# Patient Record
Sex: Female | Born: 1960 | Race: White | Hispanic: No | Marital: Married | Smoking: Former smoker
Health system: Southern US, Community
[De-identification: ages and names within clinical notes are randomized; demographics above are authoritative.]

## PROBLEM LIST (undated history)

## (undated) DIAGNOSIS — C801 Malignant (primary) neoplasm, unspecified: Secondary | ICD-10-CM

## (undated) DIAGNOSIS — E039 Hypothyroidism, unspecified: Secondary | ICD-10-CM

## (undated) DIAGNOSIS — I10 Essential (primary) hypertension: Secondary | ICD-10-CM

## (undated) DIAGNOSIS — F419 Anxiety disorder, unspecified: Secondary | ICD-10-CM

## (undated) DIAGNOSIS — K219 Gastro-esophageal reflux disease without esophagitis: Secondary | ICD-10-CM

## (undated) DIAGNOSIS — Z9889 Other specified postprocedural states: Secondary | ICD-10-CM

## (undated) DIAGNOSIS — D509 Iron deficiency anemia, unspecified: Secondary | ICD-10-CM

## (undated) DIAGNOSIS — T8859XA Other complications of anesthesia, initial encounter: Secondary | ICD-10-CM

## (undated) DIAGNOSIS — I499 Cardiac arrhythmia, unspecified: Secondary | ICD-10-CM

## (undated) DIAGNOSIS — R112 Nausea with vomiting, unspecified: Secondary | ICD-10-CM

## (undated) DIAGNOSIS — Z87442 Personal history of urinary calculi: Secondary | ICD-10-CM

## (undated) HISTORY — PX: CHOLECYSTECTOMY: SHX55

## (undated) HISTORY — DX: Hypothyroidism, unspecified: E03.9

## (undated) HISTORY — PX: OTHER SURGICAL HISTORY: SHX169

## (undated) HISTORY — DX: Iron deficiency anemia, unspecified: D50.9

## (undated) HISTORY — PX: TONSILLECTOMY: SUR1361

---

## 1999-04-25 ENCOUNTER — Ambulatory Visit (HOSPITAL_BASED_OUTPATIENT_CLINIC_OR_DEPARTMENT_OTHER): Admission: RE | Admit: 1999-04-25 | Discharge: 1999-04-25 | Payer: Self-pay | Admitting: Orthopedic Surgery

## 2002-03-24 HISTORY — PX: GASTRIC BYPASS: SHX52

## 2002-12-02 ENCOUNTER — Ambulatory Visit (HOSPITAL_COMMUNITY): Admission: RE | Admit: 2002-12-02 | Discharge: 2002-12-02 | Payer: Self-pay | Admitting: Family Medicine

## 2002-12-05 ENCOUNTER — Ambulatory Visit (HOSPITAL_COMMUNITY): Admission: RE | Admit: 2002-12-05 | Discharge: 2002-12-05 | Payer: Self-pay | Admitting: Family Medicine

## 2002-12-21 ENCOUNTER — Ambulatory Visit (HOSPITAL_COMMUNITY): Admission: RE | Admit: 2002-12-21 | Discharge: 2002-12-21 | Payer: Self-pay | Admitting: Cardiology

## 2003-07-27 ENCOUNTER — Ambulatory Visit (HOSPITAL_COMMUNITY): Admission: RE | Admit: 2003-07-27 | Discharge: 2003-07-27 | Payer: Self-pay | Admitting: Family Medicine

## 2003-08-04 ENCOUNTER — Emergency Department (HOSPITAL_COMMUNITY): Admission: EM | Admit: 2003-08-04 | Discharge: 2003-08-04 | Payer: Self-pay | Admitting: Emergency Medicine

## 2007-02-01 ENCOUNTER — Ambulatory Visit (HOSPITAL_COMMUNITY): Admission: RE | Admit: 2007-02-01 | Discharge: 2007-02-01 | Payer: Self-pay | Admitting: Obstetrics and Gynecology

## 2007-02-08 ENCOUNTER — Other Ambulatory Visit: Admission: RE | Admit: 2007-02-08 | Discharge: 2007-02-08 | Payer: Self-pay | Admitting: Obstetrics and Gynecology

## 2008-12-13 ENCOUNTER — Other Ambulatory Visit: Admission: RE | Admit: 2008-12-13 | Discharge: 2008-12-13 | Payer: Self-pay | Admitting: Obstetrics and Gynecology

## 2010-04-14 ENCOUNTER — Encounter: Payer: Self-pay | Admitting: Obstetrics and Gynecology

## 2010-08-09 NOTE — Procedures (Signed)
   NAME:  Raven Ferguson, Raven Ferguson NO.:  [id number]   MEDICAL RECORD NO.:  [id number]                   PATIENT TYPE:  OUT   LOCATION:  D[facility]                                 FACILITY:  [facility]   PHYSICIAN:  Donna Bernard, M.D.             DATE OF BIRTH:  [birth year]   DATE OF PROCEDURE:  DATE OF DISCHARGE:  12/02/2002                                    STRESS TEST   INDICATIONS FOR STRESS TEST:  This patient is a 50 year old white female  with a history of exertional dyspnea and atypical chest discomfort.  The  primary reason for the test was sorting out her surgical risk for obesity  surgery.   Stress test was performed at standard [facility] protocol.  Resting EKG revealed  a normal sinus rhythm, no significant ST-T changes.  The patient had  profound development of exertional dyspnea very quickly into the first  stage.  She reached a maximum heart rate of 130 which was well below her  submax predicted heart rate of 151.  The patient was expressing so much  concern about fatigue and she looked to be in so much distress, that I  elected, at that point, to stop the test.  At this point her heart rate had  not reached a point to make it reliable in terms of assessing for ischemia.  The patient does show tremendously poor exertional capacity with these  results.   IMPRESSION:  Negative, inadequate stress test.   PLAN:  Cardiology referral for further assessment.      ___________________________________________                                            Donna Bernard, M.D.   WSL/MEDQ  D:  12/13/2002  T:  12/13/2002  Job:  [id number]

## 2010-08-09 NOTE — Procedures (Signed)
   NAME:  Raven Ferguson, Raven Ferguson                           ACCOUNT NO.:  [id number]   MEDICAL RECORD NO.:  [id number]                   PATIENT TYPE:  OUT   LOCATION:  RESP                                 FACILITY:  [facility]   PHYSICIAN:  Edward L. Juanetta Gosling, M.D.             DATE OF BIRTH:  1960-03-29   DATE OF PROCEDURE:  12/06/2002  DATE OF DISCHARGE:                              PULMONARY FUNCTION TEST   IMPRESSION:  1. Spirometry shows a moderate ventilatory defect without definite airflow     obstruction.  2. Lung volumes show mild restrictive change.  3. DLCO is moderately reduced.  4. There is increase in airflow with inhaled bronchodilator, but it does not     meet criteria for significant effect.  5. Arterial blood gases show normal oxygenation and slightly increased pCO2.                                               Edward L. Juanetta Gosling, M.D.    ELH/MEDQ  D:  12/06/2002  T:  12/06/2002  Job:  [id number]   cc:   Donna Bernard, M.D.  [address]  [place]  [place] [address]  Fax: [phone]

## 2012-10-11 ENCOUNTER — Ambulatory Visit (INDEPENDENT_AMBULATORY_CARE_PROVIDER_SITE_OTHER): Payer: Self-pay | Admitting: Family Medicine

## 2012-10-11 ENCOUNTER — Encounter: Payer: Self-pay | Admitting: Family Medicine

## 2012-10-11 VITALS — BP 130/88 | Temp 98.8°F | Wt 171.0 lb

## 2012-10-11 DIAGNOSIS — K2990 Gastroduodenitis, unspecified, without bleeding: Secondary | ICD-10-CM

## 2012-10-11 DIAGNOSIS — E039 Hypothyroidism, unspecified: Secondary | ICD-10-CM | POA: Insufficient documentation

## 2012-10-11 DIAGNOSIS — K219 Gastro-esophageal reflux disease without esophagitis: Secondary | ICD-10-CM | POA: Insufficient documentation

## 2012-10-11 DIAGNOSIS — K2970 Gastritis, unspecified, without bleeding: Secondary | ICD-10-CM

## 2012-10-11 MED ORDER — PANTOPRAZOLE SODIUM 40 MG PO TBEC: 40.0000 mg | DELAYED_RELEASE_TABLET | Freq: Every day | ORAL | Status: DC

## 2012-10-11 NOTE — Progress Notes (Signed)
  Subjective:    Patient ID: Raven Ferguson, female    DOB: 1960/04/22, 52 y.o.   MRN: [id number]  GI Problem The primary symptoms include nausea and vomiting. The illness began more than 7 days ago. The problem has been gradually worsening.  The illness does not include anorexia. Significant associated medical issues include gastric bypass. Associated medical issues do not include inflammatory bowel disease, GERD or gallstones.  taking prilosec in the past, two a day helped a little  Uses a ton of maalox and peptobismol--not much helpGas x hasn't helped  Reach in the back of throat and make herself gag--gets up "nothing  Bloating sensation with pressure--felt like abubble. Unable to eat much.    Review of Systems  Gastrointestinal: Positive for nausea and vomiting. Negative for anorexia.   ROS otherwise negative     Objective:   Physical Exam  Alert vital signs stable. Lungs clear. Heart regular rate and rhythm. Some epigastric tenderness.      Assessment & Plan:  Impression probable gastritis with element of reflux post GI bypass. Plan encouraged to stop smoking. Trial protonic 40 every morning. GI consult for this plus colonoscopy. WSL

## 2012-10-13 ENCOUNTER — Other Ambulatory Visit: Payer: Self-pay | Admitting: Nurse Practitioner

## 2012-10-13 NOTE — Telephone Encounter (Signed)
Last filled Aug 07, 2012

## 2012-10-13 NOTE — Telephone Encounter (Signed)
Did we not just rx this when did she last get a rx?

## 2012-10-14 NOTE — Telephone Encounter (Signed)
Ok times one 

## 2012-10-14 NOTE — Telephone Encounter (Signed)
RX called in on voicemail 

## 2012-11-08 ENCOUNTER — Ambulatory Visit: Payer: Self-pay | Admitting: Gastroenterology

## 2012-11-30 ENCOUNTER — Other Ambulatory Visit: Payer: Self-pay | Admitting: Internal Medicine

## 2012-11-30 ENCOUNTER — Ambulatory Visit (INDEPENDENT_AMBULATORY_CARE_PROVIDER_SITE_OTHER): Payer: Self-pay | Admitting: Gastroenterology

## 2012-11-30 ENCOUNTER — Encounter: Payer: Self-pay | Admitting: Gastroenterology

## 2012-11-30 VITALS — BP 119/72 | HR 54 | Temp 98.0°F | Ht 65.0 in | Wt 173.0 lb

## 2012-11-30 DIAGNOSIS — R1013 Epigastric pain: Secondary | ICD-10-CM | POA: Insufficient documentation

## 2012-11-30 DIAGNOSIS — G8929 Other chronic pain: Secondary | ICD-10-CM

## 2012-11-30 DIAGNOSIS — Z1211 Encounter for screening for malignant neoplasm of colon: Secondary | ICD-10-CM

## 2012-11-30 MED ORDER — PANTOPRAZOLE SODIUM 40 MG PO TBEC: 40.0000 mg | DELAYED_RELEASE_TABLET | Freq: Every day | ORAL | Status: DC

## 2012-11-30 NOTE — Progress Notes (Signed)
Primary Care Physician:  Harlow Asa, MD  Primary Gastroenterologist:  Roetta Sessions, MD   Chief Complaint  Patient presents with  . Gastrophageal Reflux    HPI:  Raven Ferguson is a 52 y.o. female here at the request of Dr. Lubertha South for further evaluation recurrent nausea and vomiting, epigastric pain since gastric bypass in 2004. Also needs colonoscopy.  Patient states she underwent gastric bypass for weight loss surgery in November 2004 at [facility]. Since that time she has had recurrent epigastric pain. Initially was more frequently but then had settled down up until several months ago. Chronically has been on Omeprazole BID. Recently started pantoprazole and feels better. Pain is usually in the epigastric region. Severe enough that she goes to bed with it. May last for 12-16 hours. Goes away on its on. N/V foam-like material. Diaphoresis with episodes. Loose 3-4 pounds with episodes. Cannot tolerate any fluids/foods. Restless in the bed. Hot/cold. At the very end, terrible headache. Has been occurring every 4-6 weeks over the past several years. Recently had 3 episodes in one week. No episodes in the past six weeks, since on pantoprazole much better.   Has previously tried Maalox, Pepto-Bismol, TUMs/Rolaids, omeprazole BID. Denies constipation, diarrhea, melena, rectal bleeding, dysphagia. Weight loss surgery was a success. Previously weighed almost 400 pounds and continues to lose weight. Notes that she has chronic iron deficiency, previously advised she should have iron infusion but she did not have insurance. PCP "keeps a check on it". Stop her oral iron due to abdominal pain.   Current Outpatient Prescriptions  Medication Sig Dispense Refill  . diazepam (VALIUM) 10 MG tablet Take 5 mg by mouth at bedtime.      . Glucosamine-Chondroitin (COSAMIN DS PO) Take by mouth daily.      Marland Kitchen levothyroxine (SYNTHROID, LEVOTHROID) 150 MCG tablet Take 150 mcg by mouth daily before breakfast.       . pantoprazole (PROTONIX) 40 MG tablet Take 1 tablet (40 mg total) by mouth daily.  30 tablet  5  . PARoxetine (PAXIL) 20 MG tablet Take 20 mg by mouth every morning.       No current facility-administered medications for this visit.    Allergies as of 11/30/2012  . (No Known Allergies)    Past Medical History  Diagnosis Date  . Gestational diabetes   . Hypothyroidism   . Morbid obesity   . Iron deficiency anemia     Past Surgical History  Procedure Laterality Date  . Cesarean section    . Gastric bypass  2004  . Skin reduction      with hernia repair    Family History  Problem Relation Age of Onset  . Hyperlipidemia Father   . Cancer Maternal Aunt     breast  . Diabetes Paternal Grandmother   . Heart attack Paternal Grandmother     History   Social History  . Marital Status: Married    Spouse Name: N/A    Number of Children: N/A  . Years of Education: N/A   Occupational History  . Not on file.   Social History Main Topics  . Smoking status: Current Every Day Smoker  . Smokeless tobacco: Not on file  . Alcohol Use: Not on file  . Drug Use: Not on file  . Sexual Activity: Not on file   Other Topics Concern  . Not on file   Social History Narrative  . No narrative on file      ROS:  General: Negative for anorexia, weight loss, fever, chills, fatigue, weakness. Eyes: Negative for vision changes.  ENT: Negative for hoarseness, difficulty swallowing , nasal congestion. CV: Negative for chest pain, angina, palpitations, dyspnea on exertion, peripheral edema.  Respiratory: Negative for dyspnea at rest, dyspnea on exertion, cough, sputum, wheezing.  GI: See history of present illness. GU:  Negative for dysuria, hematuria, urinary incontinence, urinary frequency, nocturnal urination.  MS: Negative for joint pain, low back pain.  Derm: Negative for rash or itching.  Neuro: Negative for weakness, abnormal sensation, seizure, frequent headaches, memory  loss, confusion.  Psych: Negative for anxiety, depression, suicidal ideation, hallucinations.  Endo: Negative for unusual weight change.  Heme: Negative for bruising or bleeding. Allergy: Negative for rash or hives.    Physical Examination:  BP 119/72  Pulse 54  Temp(Src) 98 F (36.7 C) (Oral)  Ht 5\' 5"  (1.651 m)  Wt 173 lb (78.472 kg)  BMI 28.79 kg/m2   General: Well-nourished, well-developed in no acute distress.  Head: Normocephalic, atraumatic.   Eyes: Conjunctiva pink, no icterus. Mouth: Oropharyngeal mucosa moist and pink , no lesions erythema or exudate. Neck: Supple without thyromegaly, masses, or lymphadenopathy.  Lungs: Clear to auscultation bilaterally.  Heart: Regular rate and rhythm, no murmurs rubs or gallops.  Abdomen: Bowel sounds are normal, nontender, nondistended, no hepatosplenomegaly or masses, no abdominal bruits, no rebound or guarding. Ventral hernia.   Rectal: defer Extremities: No lower extremity edema. No clubbing or deformities.  Neuro: Alert and oriented x 4 , grossly normal neurologically.  Skin: Warm and dry, no rash or jaundice.   Psych: Alert and cooperative, normal mood and affect.

## 2012-11-30 NOTE — Assessment & Plan Note (Signed)
No prior colonoscopy. First ever screening colonoscopy at this time.  I have discussed the risks, alternatives, benefits with regards to but not limited to the risk of reaction to medication, bleeding, infection, perforation and the patient is agreeable to proceed. Written consent to be obtained.  Consider CBC to follow up on h/o iron deficiency. Await colonoscopy/EGD findings.

## 2012-11-30 NOTE — Assessment & Plan Note (Signed)
Recurrent epigastric pain associated with vomiting since gastric bypass in 2004. Doing better on pantoprazole. Previously failed omeprazole twice a day. Differential diagnosis includes gastritis, peptic ulcer disease, anastomotic ulcer, partial gastric outlet obstruction, GERD. Cannot exclude biliary etiology at this time.  Recommend EGD for further evaluation. She is feeling better on pantoprazole. New prescription provided.  I have discussed the risks, alternatives, benefits with regards to but not limited to the risk of reaction to medication, bleeding, infection, perforation and the patient is agreeable to proceed. Written consent to be obtained.  Based on findings, consider labs including LFTs, CBC

## 2012-11-30 NOTE — Patient Instructions (Addendum)
1. Continue pantoprazole once daily. Prescription sent to [facility]. 2. We have scheduled you for a colonoscopy and upper endoscopy. Please see separate instructions.

## 2012-12-01 NOTE — Progress Notes (Signed)
cc'd to PCP °

## 2012-12-02 ENCOUNTER — Encounter (HOSPITAL_COMMUNITY): Payer: Self-pay | Admitting: *Deleted

## 2012-12-02 ENCOUNTER — Encounter (HOSPITAL_COMMUNITY)
Admission: RE | Disposition: A | Discharge status: Home / Self Care | Payer: Self-pay | Source: Ambulatory Visit | Attending: Internal Medicine

## 2012-12-02 ENCOUNTER — Ambulatory Visit (HOSPITAL_COMMUNITY)
Admission: RE | Admit: 2012-12-02 | Discharge: 2012-12-02 | Disposition: A | Discharge status: Home / Self Care | Payer: Self-pay | Source: Ambulatory Visit | Attending: Internal Medicine | Admitting: Internal Medicine

## 2012-12-02 DIAGNOSIS — Z9884 Bariatric surgery status: Secondary | ICD-10-CM

## 2012-12-02 DIAGNOSIS — K5730 Diverticulosis of large intestine without perforation or abscess without bleeding: Secondary | ICD-10-CM

## 2012-12-02 DIAGNOSIS — D126 Benign neoplasm of colon, unspecified: Secondary | ICD-10-CM | POA: Insufficient documentation

## 2012-12-02 DIAGNOSIS — Z1211 Encounter for screening for malignant neoplasm of colon: Secondary | ICD-10-CM | POA: Insufficient documentation

## 2012-12-02 DIAGNOSIS — R1013 Epigastric pain: Secondary | ICD-10-CM | POA: Insufficient documentation

## 2012-12-02 DIAGNOSIS — G8929 Other chronic pain: Secondary | ICD-10-CM

## 2012-12-02 DIAGNOSIS — R112 Nausea with vomiting, unspecified: Secondary | ICD-10-CM

## 2012-12-02 HISTORY — PX: COLONOSCOPY WITH ESOPHAGOGASTRODUODENOSCOPY (EGD): SHX5779

## 2012-12-02 SURGERY — COLONOSCOPY WITH ESOPHAGOGASTRODUODENOSCOPY (EGD)
Anesthesia: Moderate Sedation

## 2012-12-02 MED ORDER — ONDANSETRON HCL 4 MG/2ML IJ SOLN
INTRAMUSCULAR | Status: DC | PRN
  Administered 2012-12-02: 4 mg via INTRAVENOUS

## 2012-12-02 MED ORDER — BUTAMBEN-TETRACAINE-BENZOCAINE 2-2-14 % EX AERO
INHALATION_SPRAY | CUTANEOUS | Status: DC | PRN
  Administered 2012-12-02: 2 via TOPICAL

## 2012-12-02 MED ORDER — MEPERIDINE HCL 100 MG/ML IJ SOLN
INTRAMUSCULAR | Status: AC
  Filled 2012-12-02: qty 2

## 2012-12-02 MED ORDER — MIDAZOLAM HCL 5 MG/5ML IJ SOLN
INTRAMUSCULAR | Status: DC | PRN
  Administered 2012-12-02 (×2): 2 mg via INTRAVENOUS
  Administered 2012-12-02 (×4): 1 mg via INTRAVENOUS

## 2012-12-02 MED ORDER — MIDAZOLAM HCL 5 MG/5ML IJ SOLN
INTRAMUSCULAR | Status: AC
  Filled 2012-12-02: qty 10

## 2012-12-02 MED ORDER — STERILE WATER FOR IRRIGATION IR SOLN
Status: DC | PRN
  Administered 2012-12-02: 11:00:00

## 2012-12-02 MED ORDER — ONDANSETRON HCL 4 MG/2ML IJ SOLN
INTRAMUSCULAR | Status: AC
  Filled 2012-12-02: qty 2

## 2012-12-02 MED ORDER — SODIUM CHLORIDE 0.9 % IV SOLN
INTRAVENOUS | Status: DC
  Administered 2012-12-02: 11:00:00 via INTRAVENOUS

## 2012-12-02 MED ORDER — MEPERIDINE HCL 100 MG/ML IJ SOLN
INTRAMUSCULAR | Status: DC | PRN
  Administered 2012-12-02: 25 mg via INTRAVENOUS
  Administered 2012-12-02 (×2): 50 mg via INTRAVENOUS

## 2012-12-02 NOTE — Interval H&P Note (Signed)
History and Physical Interval Note:  12/02/2012 11:19 AM  Raven Ferguson  has presented today for surgery, with the diagnosis of Epigastric Pain, screening colonoscopy  The various methods of treatment have been discussed with the patient and family. After consideration of risks, benefits and other options for treatment, the patient has consented to  Procedure(s) with comments: COLONOSCOPY WITH ESOPHAGOGASTRODUODENOSCOPY (EGD) (N/A) - 11:15 as a surgical intervention .  The patient's history has been reviewed, patient examined, no change in status, stable for surgery.  I have reviewed the patient's chart and labs.  Questions were answered to the patient's satisfaction.     This lady reports a dramatic and sustained improvement in nausea/abdominal discomfort (essentially resolved) since starting pantoprazole 2 months ago.  EGD and colonoscopy per plan.The risks, benefits, limitations, imponderables and alternatives regarding both EGD and colonoscopy have been reviewed with the patient. Questions have been answered. All parties agreeable.    [name]

## 2012-12-02 NOTE — H&P (View-Only) (Signed)
Primary Care Physician:  LUKING,W S, MD  Primary Gastroenterologist:  Michael Rourk, MD   Chief Complaint  Patient presents with  . Gastrophageal Reflux    HPI:  Raven Ferguson is a 52 y.o. female here at the request of Dr. Steve Luking for further evaluation recurrent nausea and vomiting, epigastric pain since gastric bypass in 2004. Also needs colonoscopy.  Patient states she underwent gastric bypass for weight loss surgery in November 2004 at [facility]. Since that time she has had recurrent epigastric pain. Initially was more frequently but then had settled down up until several months ago. Chronically has been on Omeprazole BID. Recently started pantoprazole and feels better. Pain is usually in the epigastric region. Severe enough that she goes to bed with it. May last for 12-16 hours. Goes away on its on. N/V foam-like material. Diaphoresis with episodes. Loose 3-4 pounds with episodes. Cannot tolerate any fluids/foods. Restless in the bed. Hot/cold. At the very end, terrible headache. Has been occurring every 4-6 weeks over the past several years. Recently had 3 episodes in one week. No episodes in the past six weeks, since on pantoprazole much better.   Has previously tried Maalox, Pepto-Bismol, TUMs/Rolaids, omeprazole BID. Denies constipation, diarrhea, melena, rectal bleeding, dysphagia. Weight loss surgery was a success. Previously weighed almost 400 pounds and continues to lose weight. Notes that she has chronic iron deficiency, previously advised she should have iron infusion but she did not have insurance. PCP "keeps a check on it". Stop her oral iron due to abdominal pain.   Current Outpatient Prescriptions  Medication Sig Dispense Refill  . diazepam (VALIUM) 10 MG tablet Take 5 mg by mouth at bedtime.      . Glucosamine-Chondroitin (COSAMIN DS PO) Take by mouth daily.      . levothyroxine (SYNTHROID, LEVOTHROID) 150 MCG tablet Take 150 mcg by mouth daily before breakfast.       . pantoprazole (PROTONIX) 40 MG tablet Take 1 tablet (40 mg total) by mouth daily.  30 tablet  5  . PARoxetine (PAXIL) 20 MG tablet Take 20 mg by mouth every morning.       No current facility-administered medications for this visit.    Allergies as of 11/30/2012  . (No Known Allergies)    Past Medical History  Diagnosis Date  . Gestational diabetes   . Hypothyroidism   . Morbid obesity   . Iron deficiency anemia     Past Surgical History  Procedure Laterality Date  . Cesarean section    . Gastric bypass  2004  . Skin reduction      with hernia repair    Family History  Problem Relation Age of Onset  . Hyperlipidemia Father   . Cancer Maternal Aunt     breast  . Diabetes Paternal Grandmother   . Heart attack Paternal Grandmother     History   Social History  . Marital Status: Married    Spouse Name: N/A    Number of Children: N/A  . Years of Education: N/A   Occupational History  . Not on file.   Social History Main Topics  . Smoking status: Current Every Day Smoker  . Smokeless tobacco: Not on file  . Alcohol Use: Not on file  . Drug Use: Not on file  . Sexual Activity: Not on file   Other Topics Concern  . Not on file   Social History Narrative  . No narrative on file      ROS:    General: Negative for anorexia, weight loss, fever, chills, fatigue, weakness. Eyes: Negative for vision changes.  ENT: Negative for hoarseness, difficulty swallowing , nasal congestion. CV: Negative for chest pain, angina, palpitations, dyspnea on exertion, peripheral edema.  Respiratory: Negative for dyspnea at rest, dyspnea on exertion, cough, sputum, wheezing.  GI: See history of present illness. GU:  Negative for dysuria, hematuria, urinary incontinence, urinary frequency, nocturnal urination.  MS: Negative for joint pain, low back pain.  Derm: Negative for rash or itching.  Neuro: Negative for weakness, abnormal sensation, seizure, frequent headaches, memory  loss, confusion.  Psych: Negative for anxiety, depression, suicidal ideation, hallucinations.  Endo: Negative for unusual weight change.  Heme: Negative for bruising or bleeding. Allergy: Negative for rash or hives.    Physical Examination:  BP 119/72  Pulse 54  Temp(Src) 98 F (36.7 C) (Oral)  Ht 5' 5" (1.651 m)  Wt 173 lb (78.472 kg)  BMI 28.79 kg/m2   General: Well-nourished, well-developed in no acute distress.  Head: Normocephalic, atraumatic.   Eyes: Conjunctiva pink, no icterus. Mouth: Oropharyngeal mucosa moist and pink , no lesions erythema or exudate. Neck: Supple without thyromegaly, masses, or lymphadenopathy.  Lungs: Clear to auscultation bilaterally.  Heart: Regular rate and rhythm, no murmurs rubs or gallops.  Abdomen: Bowel sounds are normal, nontender, nondistended, no hepatosplenomegaly or masses, no abdominal bruits, no rebound or guarding. Ventral hernia.   Rectal: defer Extremities: No lower extremity edema. No clubbing or deformities.  Neuro: Alert and oriented x 4 , grossly normal neurologically.  Skin: Warm and dry, no rash or jaundice.   Psych: Alert and cooperative, normal mood and affect.    

## 2012-12-02 NOTE — Op Note (Signed)
[facility] [address] [place], [address]   COLONOSCOPY PROCEDURE REPORT  PATIENT: Raven, Ferguson  MR#:         [id number] BIRTHDATE: 1960-10-13 , 52  yrs. old GENDER: Female ENDOSCOPIST: R.  Roetta Sessions, MD FACP [facility] REFERRED BY:  Simone Curia, M.D. PROCEDURE DATE:  12/02/2012 PROCEDURE:     Ileocolonoscopy with biopsy  INDICATIONS: First-ever average risk colorectal cancer screening examination  INFORMED CONSENT:  The risks, benefits, alternatives and imponderables including but not limited to bleeding, perforation as well as the possibility of a missed lesion have been reviewed.  The potential for biopsy, lesion removal, etc. have also been discussed.  Questions have been answered.  All parties agreeable. Please see the history and physical in the medical record for more information.  MEDICATIONS: Versed 8 mg IV and Demerol 125 mg IV in divided doses. Zofran 4 mg IV.  DESCRIPTION OF PROCEDURE:  After a digital rectal exam was performed, the     colonoscope was advanced from the anus through the rectum and colon to the area of the cecum, ileocecal valve and appendiceal orifice.  The cecum was deeply intubated.  These structures were well-seen and photographed for the record.  From the level of the cecum and ileocecal valve, the scope was slowly and cautiously withdrawn.  The mucosal surfaces were carefully surveyed utilizing scope tip deflection to facilitate fold flattening as needed.  The scope was pulled down into the rectum where a thorough examination including retroflexion was performed.     FINDINGS:  Adequate preparation. Normal rectum. Scattered sigmoid diverticula; (1) 4 mm polyp in the mid sigmoid segment; otherwise, the remainder of the colonic mucosa appeared normal. The distal 5 cm of terminal ileal mucosa also appeared normal.  THERAPEUTIC / DIAGNOSTIC MANEUVERS PERFORMED:  The above-mentioned polyp was cold  biopsied/removed.  COMPLICATIONS: None  CECAL WITHDRAWAL TIME:  8 minutes  IMPRESSION:  Colonic diverticulosis. Single colonic polyp-removed as described above  RECOMMENDATIONS: Followup on pathology. See EGD report.   _______________________________ eSigned:  R. Roetta Sessions, MD FACP [facility] 12/02/2012 12:10 PM   CC:    PATIENT NAME:  Raven, Ferguson MR#: [id number]

## 2012-12-02 NOTE — Op Note (Signed)
[facility] [address] [place], [address]   ENDOSCOPY PROCEDURE REPORT  PATIENT: Raven Ferguson, Raven Ferguson  MR#: [id number] BIRTHDATE: Aug 27, 1960 , 52  yrs. old GENDER: Female ENDOSCOPIST: R.  Roetta Sessions, MD FACP [facility] REFERRED BY:  Simone Curia, M.D. PROCEDURE DATE:  12/02/2012 PROCEDURE:     EGD with biopsy  INDICATIONS:     nausea and vomiting  -  status post bariatric surgery-symptoms now much improved with pantoprazole therapy INFORMED CONSENT:   The risks, benefits, limitations, alternatives and imponderables have been discussed.  The potential for biopsy, esophogeal dilation, etc. have also been reviewed.  Questions have been answered.  All parties agreeable.  Please see the history and physical in the medical record for more information.  MEDICATIONS:  Versed 6 mg IV and Demerol 125 mg IV in divided doses. Zofran 4 mg on a. Cetacaine spray.  DESCRIPTION OF PROCEDURE:   The     endoscope was introduced through the mouth and advanced to the second portion of the duodenum without difficulty or limitations.  The mucosal surfaces were surveyed very carefully during advancement of the scope and upon withdrawal.  Retroflexion view of the proximal stomach and esophagogastric junction was performed.      FINDINGS: Normal tubular esophagus. Surgically altered stomach; residual gastric pouch very small with outlet to efferent  small bowel limb. Some protruding sutures present. Mucosa at anastomosis appears somewhat irregular; certainly, did not see any overt infiltrating process. The residual gastric and small bowel mucosa appeared otherwise  entirely normal.  THERAPEUTIC / DIAGNOSTIC MANEUVERS PERFORMED:  biopsies of the anastomotic mucosa taken for histologic study   COMPLICATIONS:  None  IMPRESSION:  Status post bariatric surgery.   Abnormal anastomosis of doubtful clinical significance-status post biopsy  Patient has become virtually asymptomatic as  far as her upper GI tract symptoms are concerned with pantoprazole 40 mg once daily. I suspect her recent symptoms are largely related to poorly controlled GERD  RECOMMENDATIONS:  Continue pantoprazole 40 mg daily.  Followup on pathology. See colonoscopy report.    _______________________________ R. Roetta Sessions, MD FACP [facility] eSigned:  R. Roetta Sessions, MD FACP [facility] 12/02/2012 11:50 AM     CC:

## 2012-12-06 ENCOUNTER — Other Ambulatory Visit: Payer: Self-pay | Admitting: Nurse Practitioner

## 2012-12-08 ENCOUNTER — Encounter (HOSPITAL_COMMUNITY): Payer: Self-pay | Admitting: Internal Medicine

## 2012-12-11 ENCOUNTER — Encounter: Payer: Self-pay | Admitting: Internal Medicine

## 2012-12-21 ENCOUNTER — Telehealth: Payer: Self-pay | Admitting: Gastroenterology

## 2012-12-21 ENCOUNTER — Encounter: Payer: Self-pay | Admitting: Internal Medicine

## 2012-12-21 NOTE — Telephone Encounter (Signed)
Pt has follow up appt with [place] on 01/11/13

## 2012-12-21 NOTE — Telephone Encounter (Signed)
Please encourage patient to continue to monitor for IDA. She can have CBC, iron/ferritin done with [place] or with PCP.

## 2013-01-11 ENCOUNTER — Encounter: Payer: Self-pay | Admitting: Gastroenterology

## 2013-01-11 ENCOUNTER — Encounter (INDEPENDENT_AMBULATORY_CARE_PROVIDER_SITE_OTHER): Payer: Self-pay

## 2013-01-11 ENCOUNTER — Ambulatory Visit (INDEPENDENT_AMBULATORY_CARE_PROVIDER_SITE_OTHER): Payer: Self-pay | Admitting: Gastroenterology

## 2013-01-11 VITALS — BP 130/73 | HR 55 | Temp 97.6°F | Wt 174.2 lb

## 2013-01-11 DIAGNOSIS — R1013 Epigastric pain: Secondary | ICD-10-CM

## 2013-01-11 NOTE — Progress Notes (Signed)
cc'd to pcp 

## 2013-01-11 NOTE — Progress Notes (Signed)
Primary Care Physician: Harlow Asa, MD  Primary Gastroenterologist:  Roetta Sessions, MD   Chief Complaint  Patient presents with  . Follow-up    HPI: Raven Ferguson is a 52 y.o. female here for followup of recent procedures. She had an EGD done for nausea and vomiting/epigastric pain on 12/02/2012. She status post bariatric surgery. She had an abnormal anastomosis of doubtful clinical significance, biopsy was unremarkable. Ileocolonoscopy done for colon cancer screening. She had colonic diverticulosis and a single colonic polyp which is benign. Recommended to have a followup colonoscopy in 10 years.  Prior to her initial visit with [place] she was on omeprazole twice daily. She was switched to pantoprazole with marked improvement of her symptoms. She also has a history of chronic iron deficiency anemia, previously recommended iron infusion but she did not have done due to lack of insurance. Recently stopped her oral iron therapy distal abdominal pain.  Patient states that her symptoms have completely resolved on pantoprazole. She's been doing remarkably well. No bowel issues. No blood in the stool or melena. No burning in the stomach. She goes to see her PCP on Friday and plans to have her iron level checked again.  Current Outpatient Prescriptions  Medication Sig Dispense Refill  . diazepam (VALIUM) 10 MG tablet Take 5 mg by mouth at bedtime.      . Glucosamine-Chondroitin (COSAMIN DS PO) Take by mouth daily.      Marland Kitchen levothyroxine (SYNTHROID, LEVOTHROID) 150 MCG tablet Take 150 mcg by mouth daily before breakfast.      . pantoprazole (PROTONIX) 40 MG tablet Take 1 tablet (40 mg total) by mouth daily.  30 tablet  5  . PARoxetine (PAXIL) 20 MG tablet TAKE ONE TABLET BY MOUTH EVERY DAY  30 tablet  0   No current facility-administered medications for this visit.    Allergies as of 01/11/2013  . (No Known Allergies)    ROS:  General: Negative for anorexia, weight loss, fever, chills, fatigue,  weakness. ENT: Negative for hoarseness, difficulty swallowing , nasal congestion. CV: Negative for chest pain, angina, palpitations, dyspnea on exertion, peripheral edema.  Respiratory: Negative for dyspnea at rest, dyspnea on exertion, cough, sputum, wheezing.  GI: See history of present illness. GU:  Negative for dysuria, hematuria, urinary incontinence, urinary frequency, nocturnal urination.  Endo: Negative for unusual weight change.    Physical Examination:   BP 130/73  Pulse 55  Temp(Src) 97.6 F (36.4 C)  Wt 174 lb 3.2 oz (79.017 kg)  BMI 28.99 kg/m2  General: Well-nourished, well-developed in no acute distress.  Eyes: No icterus. Mouth: Oropharyngeal mucosa moist and pink , no lesions erythema or exudate. Lungs: Clear to auscultation bilaterally.  Heart: Regular rate and rhythm, no murmurs rubs or gallops.  Abdomen: Bowel sounds are normal, nontender, nondistended, no hepatosplenomegaly or masses, no abdominal bruits or hernia , no rebound or guarding.   Extremities: No lower extremity edema. No clubbing or deformities. Neuro: Alert and oriented x 4   Skin: Warm and dry, no jaundice.   Psych: Alert and cooperative, normal mood and affect.

## 2013-01-11 NOTE — Patient Instructions (Signed)
1. Continue to have your iron monitored by your PCP. 2. Continue pantoprazole daily for next 1-2 months. Then you can try to wean off of it. If recurrent symptoms, start back on it daily to every other day. 3. You next colonoscopy will be due at age 52. 4. Slowly add back iron supplement.

## 2013-01-11 NOTE — Assessment & Plan Note (Signed)
Resolved on pantoprazole. Continue PPI for one to 2 more months. At that time she can try weaning herself off of it but if she has return of her symptoms she should resume the medication, taking once every day to every other day. She has been encouraged to followup with her PCP regarding checking on her iron level given her history that she provides iron deficiency status post gastric bypass. Resume oral iron supplements. Office visit here as needed.

## 2013-01-14 ENCOUNTER — Ambulatory Visit (INDEPENDENT_AMBULATORY_CARE_PROVIDER_SITE_OTHER): Payer: Self-pay | Admitting: Nurse Practitioner

## 2013-01-14 ENCOUNTER — Encounter: Payer: Self-pay | Admitting: Nurse Practitioner

## 2013-01-14 VITALS — BP 152/84 | Ht 65.0 in | Wt 174.0 lb

## 2013-01-14 DIAGNOSIS — D649 Anemia, unspecified: Secondary | ICD-10-CM

## 2013-01-14 DIAGNOSIS — E039 Hypothyroidism, unspecified: Secondary | ICD-10-CM

## 2013-01-14 DIAGNOSIS — F411 Generalized anxiety disorder: Secondary | ICD-10-CM

## 2013-01-14 DIAGNOSIS — F419 Anxiety disorder, unspecified: Secondary | ICD-10-CM

## 2013-01-14 DIAGNOSIS — G4700 Insomnia, unspecified: Secondary | ICD-10-CM

## 2013-01-14 LAB — POCT HEMOGLOBIN: Hemoglobin: 12.9 g/dL (ref 12.2–16.2)

## 2013-01-14 MED ORDER — DIAZEPAM 10 MG PO TABS: 10.0000 mg | ORAL_TABLET | Freq: Every evening | ORAL | Status: DC | PRN

## 2013-01-14 MED ORDER — PAROXETINE HCL 20 MG PO TABS: ORAL_TABLET | ORAL | Status: DC

## 2013-01-14 MED ORDER — LEVOTHYROXINE SODIUM 150 MCG PO TABS: 150.0000 ug | ORAL_TABLET | Freq: Every day | ORAL | Status: DC

## 2013-01-16 ENCOUNTER — Encounter: Payer: Self-pay | Admitting: Nurse Practitioner

## 2013-01-16 DIAGNOSIS — G4700 Insomnia, unspecified: Secondary | ICD-10-CM | POA: Insufficient documentation

## 2013-01-16 DIAGNOSIS — F419 Anxiety disorder, unspecified: Secondary | ICD-10-CM | POA: Insufficient documentation

## 2013-01-16 DIAGNOSIS — E039 Hypothyroidism, unspecified: Secondary | ICD-10-CM | POA: Insufficient documentation

## 2013-01-16 NOTE — Progress Notes (Signed)
Subjective:  Presents for routine followup. Recently had followup with GI following colonoscopy and EGD. Has been off her iron supplement for about a month. Remains uninsured, needs to limit workup. Compliant with thyroid medication. Doing well with her weight. Stays active. Does not think she can quit smoking at this time due to her level of stress. Doing well on her Paxil at current dose. Uses occasional Valium only if absolutely necessary for sleep, sometimes does not use it for a week or more.  Objective:   BP 152/84  Ht 5\' 5"  (1.651 m)  Wt 174 lb (78.926 kg)  BMI 28.96 kg/m2 NAD. Alert, oriented. Calm affect. Lungs clear. Heart regular rate rhythm. Thyroid normal limit to palpation and nontender. Hemoglobin 12.9. Lab dated 03/29/2012 normal TSH and T4.  Assessment:Hypothyroidism  Anemia - Plan: POCT hemoglobin  Insomnia  Anxiety  Anemia resolved  Plan: Meds ordered this encounter  Medications  . diazepam (VALIUM) 10 MG tablet    Sig: Take 1 tablet (10 mg total) by mouth at bedtime as needed for anxiety.    Dispense:  30 tablet    Refill:  2    Order Specific Question:  Supervising Provider    Answer:  Merlyn Albert [[id number]]  . levothyroxine (SYNTHROID, LEVOTHROID) 150 MCG tablet    Sig: Take 1 tablet (150 mcg total) by mouth daily before breakfast.    Dispense:  90 tablet    Refill:  1    Order Specific Question:  Supervising Provider    Answer:  Merlyn Albert [[id number]]  . PARoxetine (PAXIL) 20 MG tablet    Sig: TAKE ONE TABLET BY MOUTH EVERY DAY    Dispense:  90 tablet    Refill:  1    Order Specific Question:  Supervising Provider    Answer:  Merlyn Albert [[id number]]   Recommend preventive health physical in the future if patient can afford it. Recheck in 6 months, call back sooner if any problems.

## 2013-01-16 NOTE — Assessment & Plan Note (Signed)
Lab work in January 2014 was normal. Continue levothyroxine at current dose.

## 2013-01-16 NOTE — Assessment & Plan Note (Signed)
Continue Paxil 20 mg daily

## 2013-01-16 NOTE — Assessment & Plan Note (Signed)
Continue sporadic use of Valium at bedtime as needed for sleep. Patient is using this very sparingly.

## 2013-03-03 ENCOUNTER — Encounter: Payer: Self-pay | Admitting: Internal Medicine

## 2013-06-30 ENCOUNTER — Other Ambulatory Visit: Payer: Self-pay | Admitting: Nurse Practitioner

## 2013-07-01 ENCOUNTER — Telehealth: Payer: Self-pay | Admitting: Nurse Practitioner

## 2013-07-01 ENCOUNTER — Other Ambulatory Visit: Payer: Self-pay | Admitting: Nurse Practitioner

## 2013-07-01 DIAGNOSIS — E039 Hypothyroidism, unspecified: Secondary | ICD-10-CM

## 2013-07-01 NOTE — Telephone Encounter (Signed)
She did not have any insurance last time I saw her. Does she just want thyroid test or other routine labs?

## 2013-07-01 NOTE — Telephone Encounter (Signed)
Patient stated that she was told she has to have her thyroid checked in order to get her medicine refilled and she is almost out. She does not have insurance and she said if she can get by without having bloodwork and is able to get her medicine refilled that would be great too.

## 2013-07-01 NOTE — Telephone Encounter (Signed)
Pt thinks she needs her BW orders for her thyroid check and what ever else you want to run  Call when ready please

## 2013-08-10 ENCOUNTER — Encounter: Payer: Self-pay | Admitting: Family Medicine

## 2013-08-10 ENCOUNTER — Ambulatory Visit (INDEPENDENT_AMBULATORY_CARE_PROVIDER_SITE_OTHER): Payer: Self-pay | Admitting: Family Medicine

## 2013-08-10 VITALS — BP 138/90 | Temp 98.5°F | Ht 65.0 in | Wt 179.0 lb

## 2013-08-10 DIAGNOSIS — M6740 Ganglion, unspecified site: Secondary | ICD-10-CM

## 2013-08-10 MED ORDER — ETODOLAC 400 MG PO TABS: 400.0000 mg | ORAL_TABLET | Freq: Two times a day (BID) | ORAL | Status: DC

## 2013-08-10 NOTE — Progress Notes (Signed)
   Subjective:    Patient ID: Raven Ferguson, female    DOB: 1961/01/11, 53 y.o.   MRN: [id number]  Wrist Pain  The pain is present in the left wrist. This is a new problem. Episode onset: 8 weeks ago. There has been no history of extremity trauma. The problem occurs daily. The problem has been gradually worsening. The quality of the pain is described as sharp, pounding, aching and burning. The pain is moderate. Associated symptoms include a limited range of motion. The symptoms are aggravated by activity. She has tried NSAIDS and rest (wrap ) for the symptoms. The treatment provided no relief.   Progressive   [id number] Review of Systems No pain elsewhere recalls no injury over-the-counter medicines and not helping much no fever no headache no chest pain ROS otherwise negative    Objective:   Physical Exam  Alert vitals stable. Lungs clear. Heart regular in rhythm. Wrist palpable ganglion on cyst. Decent range of motion. Positive tenderness to palpation.      Assessment & Plan:  Impression progressively symptomatic ganglion ounces plan anti-inflammatory medication when necessary. Symptomatic care discussed. Or so referral rationale discussed. WSL

## 2013-08-19 ENCOUNTER — Telehealth: Payer: Self-pay | Admitting: Family Medicine

## 2013-08-19 NOTE — Telephone Encounter (Signed)
Calling to check on status of referral to Dr. Aline Brochure.

## 2013-08-19 NOTE — Telephone Encounter (Signed)
Calling to check on status of referral to Dr. Althia Forts.

## 2013-08-23 NOTE — Telephone Encounter (Signed)
Spoke to pt, explained that I have her referral in my "to do" list, she will hear from Dr. Ruthe Mannan office once it's scheduled

## 2013-09-22 ENCOUNTER — Ambulatory Visit (INDEPENDENT_AMBULATORY_CARE_PROVIDER_SITE_OTHER): Payer: Self-pay

## 2013-09-22 ENCOUNTER — Ambulatory Visit (INDEPENDENT_AMBULATORY_CARE_PROVIDER_SITE_OTHER): Payer: Self-pay | Admitting: Orthopedic Surgery

## 2013-09-22 DIAGNOSIS — M25532 Pain in left wrist: Secondary | ICD-10-CM

## 2013-09-22 DIAGNOSIS — M25539 Pain in unspecified wrist: Secondary | ICD-10-CM

## 2013-09-22 NOTE — Progress Notes (Signed)
Patient ID: Raven Ferguson, female   DOB: 1960-08-11, 53 y.o.   MRN: [id number] Chief Complaint  Patient presents with  . Wrist Pain    Left wrist pain x3-4 months    53 year old female presents with history of acute onset of pain in her left wrist on the volar aspect about 3-4 months ago. Should acute pain and swelling with dull throbbing aching sensation in the volar aspect of the wrist in the insertion of the flexor carpi radialis tendon. She was treated with bracing and Lodine. The Lodine upset her stomach she went to [place] and in the last 3-4 days has noted improvement in her symptoms.  She has a medical history of some depression and thyroid disease  She's had foot surgery carpal tunnel release on the left cesarean sections gastric bypass herniorrhaphy and tummy Tuck. She is on Paxil Levoxyl and Protonix  No allergies  Family history diabetes heart disease and heart attack  Review of systems she reports difficulty sleeping vision disturbance thyroid disorder depression joint pain muscle weakness hearing aid/dentures no chest pain or respiratory problems  Her appearance is normal she is oriented x3 mood and affect are normal. Just some tenderness at the FCR insertion on the volar aspect of the wrist has full range of motion of the wrist the wrist joint is stable her strength is normal her skin is intact has good pulses and good perfusion  X-rays negative  Impression acute inflammation of the wrist possible FCR tendon  No treatment necessary patient's symptoms have resolved

## 2013-10-07 ENCOUNTER — Telehealth: Payer: Self-pay | Admitting: Family Medicine

## 2013-10-07 DIAGNOSIS — Z13220 Encounter for screening for lipoid disorders: Secondary | ICD-10-CM

## 2013-10-07 DIAGNOSIS — E039 Hypothyroidism, unspecified: Secondary | ICD-10-CM

## 2013-10-07 NOTE — Telephone Encounter (Signed)
Patient needs order for blood work. °

## 2013-10-07 NOTE — Telephone Encounter (Signed)
Only BW done in [facility] was TSH on 07/01/13

## 2013-10-09 NOTE — Telephone Encounter (Signed)
Lipid, metabolic 7, TSH. 

## 2013-10-10 NOTE — Telephone Encounter (Signed)
Notified patient via VM stating blood work orders are in. 

## 2013-10-11 LAB — BASIC METABOLIC PANEL
BUN: 11 mg/dL (ref 6–23)
CALCIUM: 9 mg/dL (ref 8.4–10.5)
CO2: 28 meq/L (ref 19–32)
CREATININE: 0.62 mg/dL (ref 0.50–1.10)
Chloride: 105 mEq/L (ref 96–112)
Glucose, Bld: 90 mg/dL (ref 70–99)
Potassium: 4.5 mEq/L (ref 3.5–5.3)
SODIUM: 139 meq/L (ref 135–145)

## 2013-10-11 LAB — LIPID PANEL
CHOL/HDL RATIO: 3.5 ratio
Cholesterol: 159 mg/dL (ref 0–200)
HDL: 46 mg/dL (ref >39)
LDL CALC: 99 mg/dL (ref 0–99)
TRIGLYCERIDES: 70 mg/dL (ref <150)
VLDL: 14 mg/dL (ref 0–40)

## 2013-10-12 LAB — TSH: TSH: 0.955 u[IU]/mL (ref 0.350–4.500)

## 2013-10-17 ENCOUNTER — Ambulatory Visit (INDEPENDENT_AMBULATORY_CARE_PROVIDER_SITE_OTHER): Payer: Self-pay | Admitting: Nurse Practitioner

## 2013-10-17 ENCOUNTER — Encounter: Payer: Self-pay | Admitting: Nurse Practitioner

## 2013-10-17 VITALS — BP 132/84 | Ht 65.0 in | Wt 180.8 lb

## 2013-10-17 DIAGNOSIS — E039 Hypothyroidism, unspecified: Secondary | ICD-10-CM

## 2013-10-17 DIAGNOSIS — G4700 Insomnia, unspecified: Secondary | ICD-10-CM

## 2013-10-17 DIAGNOSIS — K219 Gastro-esophageal reflux disease without esophagitis: Secondary | ICD-10-CM

## 2013-10-17 MED ORDER — LEVOTHYROXINE SODIUM 150 MCG PO TABS: ORAL_TABLET | ORAL | Status: DC

## 2013-10-17 MED ORDER — PAROXETINE HCL 20 MG PO TABS: ORAL_TABLET | ORAL | Status: DC

## 2013-10-17 MED ORDER — PANTOPRAZOLE SODIUM 40 MG PO TBEC: 40.0000 mg | DELAYED_RELEASE_TABLET | Freq: Every day | ORAL | Status: DC

## 2013-10-17 MED ORDER — DIAZEPAM 10 MG PO TABS: 10.0000 mg | ORAL_TABLET | Freq: Every evening | ORAL | Status: DC | PRN

## 2013-10-18 ENCOUNTER — Encounter: Payer: Self-pay | Admitting: Nurse Practitioner

## 2013-10-18 NOTE — Progress Notes (Signed)
Subjective:  Presents for routine followup. Is not taking any multivitamins or supplements. Has had bariatric surgery. Reflux well controlled with Protonix. Paxil doing well controlling her depression and anxiety. Takes Valium at bedtime for sleep, at most 3 nights per week. Appetite has been decreased lately, has been eating mainly small pretzels.  Objective:   BP 132/84  Ht 5\' 5"  (1.651 m)  Wt 180 lb 12.8 oz (82.01 kg)  BMI 30.09 kg/m2 NAD. Alert, oriented. Lungs clear. Heart regular rate rhythm. Lower extremities no edema. Thyroid: No masses recorder, nontender. See labs dated 7/20. Reviewed with patient.  Assessment: Gastroesophageal reflux disease without esophagitis  Hypothyroidism, unspecified hypothyroidism type  Insomnia  Plan:  Meds ordered this encounter  Medications  . PARoxetine (PAXIL) 20 MG tablet    Sig: TAKE ONE TABLET BY MOUTH ONCE DAILY    Dispense:  90 tablet    Refill:  1    Order Specific Question:  Supervising Provider    Answer:  Mikey Kirschner [[id number]]  . pantoprazole (PROTONIX) 40 MG tablet    Sig: Take 1 tablet (40 mg total) by mouth daily.    Dispense:  30 tablet    Refill:  5    Order Specific Question:  Supervising Provider    Answer:  Mikey Kirschner [[id number]]  . levothyroxine (SYNTHROID, LEVOTHROID) 150 MCG tablet    Sig: TAKE ONE TABLET BY MOUTH ONCE DAILY BEFORE BREAKFAST    Dispense:  90 tablet    Refill:  1    Order Specific Question:  Supervising Provider    Answer:  Mikey Kirschner [[id number]]  . diazepam (VALIUM) 10 MG tablet    Sig: Take 1 tablet (10 mg total) by mouth at bedtime as needed for anxiety.    Dispense:  30 tablet    Refill:  2    Order Specific Question:  Supervising Provider    Answer:  Mikey Kirschner [[id number]]   Unfortunately patient remains uninsured and defers physical or preventive health workup. Return in about 6 months (around 04/19/2014).  Call back sooner if any problems. Discussed importance of smoking  cessation.

## 2014-03-30 ENCOUNTER — Other Ambulatory Visit: Payer: Self-pay | Admitting: Nurse Practitioner

## 2014-04-05 ENCOUNTER — Other Ambulatory Visit: Payer: Self-pay | Admitting: *Deleted

## 2014-04-05 MED ORDER — PANTOPRAZOLE SODIUM 40 MG PO TBEC: 40.0000 mg | DELAYED_RELEASE_TABLET | Freq: Every day | ORAL | Status: DC

## 2014-06-07 ENCOUNTER — Other Ambulatory Visit: Payer: Self-pay | Admitting: Nurse Practitioner

## 2014-07-10 ENCOUNTER — Other Ambulatory Visit: Payer: Self-pay | Admitting: Family Medicine

## 2014-07-26 ENCOUNTER — Encounter: Payer: Self-pay | Admitting: Nurse Practitioner

## 2014-07-26 ENCOUNTER — Ambulatory Visit (INDEPENDENT_AMBULATORY_CARE_PROVIDER_SITE_OTHER): Payer: 59 | Admitting: Nurse Practitioner

## 2014-07-26 VITALS — BP 118/86 | Ht 65.0 in | Wt 176.8 lb

## 2014-07-26 DIAGNOSIS — F419 Anxiety disorder, unspecified: Secondary | ICD-10-CM

## 2014-07-26 DIAGNOSIS — K219 Gastro-esophageal reflux disease without esophagitis: Secondary | ICD-10-CM

## 2014-07-26 DIAGNOSIS — E039 Hypothyroidism, unspecified: Secondary | ICD-10-CM

## 2014-07-26 DIAGNOSIS — G4700 Insomnia, unspecified: Secondary | ICD-10-CM

## 2014-07-26 MED ORDER — PANTOPRAZOLE SODIUM 40 MG PO TBEC: 40.0000 mg | DELAYED_RELEASE_TABLET | Freq: Every day | ORAL | Status: DC

## 2014-07-26 MED ORDER — PAROXETINE HCL 20 MG PO TABS: ORAL_TABLET | ORAL | Status: DC

## 2014-07-26 MED ORDER — LEVOTHYROXINE SODIUM 150 MCG PO TABS
ORAL_TABLET | ORAL | Status: DC
Start: 2014-07-26 — End: 2015-01-23

## 2014-07-26 MED ORDER — DIAZEPAM 10 MG PO TABS: 10.0000 mg | ORAL_TABLET | Freq: Every evening | ORAL | Status: DC | PRN

## 2014-07-28 ENCOUNTER — Encounter: Payer: Self-pay | Admitting: Nurse Practitioner

## 2014-07-28 NOTE — Progress Notes (Signed)
Subjective:  Presents for routine follow-up. Doing very well on Paxil. Reflux stable on Protonix daily. Taking daily thyroid medicine. Rare use of Valium, can go up to a few weeks without taking it. On her last refill. Is due for her physical at [facility]. States she feels "the best she has felt in years". Has done well maintaining her weight loss.  Objective:   BP 118/86 mmHg  Ht 5\' 5"  (1.651 m)  Wt 176 lb 12.8 oz (80.196 kg)  BMI 29.42 kg/m2 NAD. Alert, oriented. Cheerful affect. Lungs clear. Heart regular rate rhythm. Abdomen soft nondistended nontender.  Assessment:  Problem List Items Addressed This Visit      Digestive   Esophageal reflux - Primary   Relevant Medications   pantoprazole (PROTONIX) 40 MG tablet     Endocrine   Hypothyroid   Relevant Medications   levothyroxine (SYNTHROID, LEVOTHROID) 150 MCG tablet     Other   Anxiety   Relevant Medications   PARoxetine (PAXIL) 20 MG tablet   diazepam (VALIUM) 10 MG tablet   Insomnia     Plan:  Meds ordered this encounter  Medications  . PARoxetine (PAXIL) 20 MG tablet    Sig: TAKE ONE TABLET BY MOUTH ONCE DAILY    Dispense:  90 tablet    Refill:  1    Order Specific Question:  Supervising Provider    Answer:  Mikey Kirschner [[id number]]  . pantoprazole (PROTONIX) 40 MG tablet    Sig: Take 1 tablet (40 mg total) by mouth daily.    Dispense:  90 tablet    Refill:  1    Order Specific Question:  Supervising Provider    Answer:  Mikey Kirschner [[id number]]  . levothyroxine (SYNTHROID, LEVOTHROID) 150 MCG tablet    Sig: TAKE ONE TABLET BY MOUTH ONCE DAILY BEFORE BREAKFAST    Dispense:  90 tablet    Refill:  1    Order Specific Question:  Supervising Provider    Answer:  Mikey Kirschner [[id number]]  . diazepam (VALIUM) 10 MG tablet    Sig: Take 1 tablet (10 mg total) by mouth at bedtime as needed for anxiety.    Dispense:  30 tablet    Refill:  2    Order Specific Question:  Supervising Provider    Answer:  Maggie Font   Strongly recommend preventive health physical. Return in about 6 months (around 01/26/2015).

## 2014-10-13 NOTE — Progress Notes (Signed)
REVIEWED-NO ADDITIONAL RECOMMENDATIONS. 

## 2015-01-23 ENCOUNTER — Other Ambulatory Visit: Payer: Self-pay | Admitting: Nurse Practitioner

## 2015-01-23 NOTE — Telephone Encounter (Signed)
Ok times six mo 

## 2015-02-23 ENCOUNTER — Encounter: Payer: Self-pay | Admitting: Nurse Practitioner

## 2015-02-23 ENCOUNTER — Ambulatory Visit (INDEPENDENT_AMBULATORY_CARE_PROVIDER_SITE_OTHER): Payer: 59 | Admitting: Nurse Practitioner

## 2015-02-23 VITALS — BP 142/90 | Ht 65.0 in | Wt 183.1 lb

## 2015-02-23 DIAGNOSIS — F419 Anxiety disorder, unspecified: Secondary | ICD-10-CM

## 2015-02-23 DIAGNOSIS — E039 Hypothyroidism, unspecified: Secondary | ICD-10-CM | POA: Diagnosis not present

## 2015-02-23 DIAGNOSIS — K219 Gastro-esophageal reflux disease without esophagitis: Secondary | ICD-10-CM

## 2015-02-23 DIAGNOSIS — Z139 Encounter for screening, unspecified: Secondary | ICD-10-CM | POA: Diagnosis not present

## 2015-02-23 MED ORDER — ALBUTEROL SULFATE HFA 108 (90 BASE) MCG/ACT IN AERS: 2.0000 | INHALATION_SPRAY | RESPIRATORY_TRACT | Status: DC | PRN

## 2015-02-23 MED ORDER — PANTOPRAZOLE SODIUM 40 MG PO TBEC: 40.0000 mg | DELAYED_RELEASE_TABLET | Freq: Every day | ORAL | Status: DC

## 2015-02-24 LAB — TSH: TSH: 1.11 u[IU]/mL (ref 0.450–4.500)

## 2015-02-24 LAB — HEPATITIS C ANTIBODY

## 2015-02-25 ENCOUNTER — Encounter: Payer: Self-pay | Admitting: Nurse Practitioner

## 2015-02-25 NOTE — Progress Notes (Signed)
Subjective:  Presents for routine follow up. Reflux stable. Doing well on Paxil. Compliant with thyroid med. Requesting hep C screening due to ad on TV. Rare use of inhaler but needs refill.   Objective:   BP 142/90 mmHg  Ht 5\' 5"  (1.651 m)  Wt 183 lb 2 oz (83.065 kg)  BMI 30.47 kg/m2 NAD. Alert, oriented. Thyroid nontender, no mass or goiter. Lungs clear. Heart RRR. Abdomen soft, non tender.   Assessment:  Problem List Items Addressed This Visit      Digestive   Esophageal reflux   Relevant Medications   pantoprazole (PROTONIX) 40 MG tablet     Endocrine   Hypothyroidism - Primary   Relevant Orders   TSH (Completed)     Other   Anxiety    Other Visit Diagnoses    Screening        Relevant Orders    Hepatitis C Antibody (Completed)      Plan:  Meds ordered this encounter  Medications  . pantoprazole (PROTONIX) 40 MG tablet    Sig: Take 1 tablet (40 mg total) by mouth daily.    Dispense:  90 tablet    Refill:  1    Order Specific Question:  Supervising Provider    Answer:  Mikey Kirschner [[id number]]  . albuterol (PROVENTIL HFA;VENTOLIN HFA) 108 (90 BASE) MCG/ACT inhaler    Sig: Inhale 2 puffs into the lungs every 4 (four) hours as needed.    Dispense:  1 Inhaler    Refill:  5    Order Specific Question:  Supervising Provider    Answer:  Maggie Font   Strongly recommend preventive health physical. Return in about 6 months (around 08/24/2015) for recheck.

## 2015-03-17 ENCOUNTER — Other Ambulatory Visit: Payer: Self-pay | Admitting: Nurse Practitioner

## 2015-03-20 ENCOUNTER — Other Ambulatory Visit: Payer: Self-pay | Admitting: *Deleted

## 2015-03-20 ENCOUNTER — Telehealth: Payer: Self-pay | Admitting: Family Medicine

## 2015-03-20 MED ORDER — PANTOPRAZOLE SODIUM 40 MG PO TBEC: 40.0000 mg | DELAYED_RELEASE_TABLET | Freq: Every day | ORAL | Status: DC

## 2015-03-20 NOTE — Telephone Encounter (Signed)
Med sent to pharm. Pt notified on vm.  

## 2015-03-20 NOTE — Telephone Encounter (Signed)
Patient needs Rx for pantoprazole (PROTONIX) 40 MG tablet   [facility] [place]

## 2015-08-17 ENCOUNTER — Other Ambulatory Visit: Payer: Self-pay | Admitting: Nurse Practitioner

## 2015-08-17 ENCOUNTER — Telehealth: Payer: Self-pay | Admitting: Family Medicine

## 2015-08-17 MED ORDER — PAROXETINE HCL 20 MG PO TABS: 20.0000 mg | ORAL_TABLET | Freq: Every day | ORAL | Status: DC

## 2015-08-17 MED ORDER — LEVOTHYROXINE SODIUM 150 MCG PO TABS: ORAL_TABLET | ORAL | Status: DC

## 2015-08-17 NOTE — Telephone Encounter (Signed)
Pt needs 90 day supplies please

## 2015-08-17 NOTE — Telephone Encounter (Signed)
Pt is needing refills on her Paxil and levothyroxine sent to [facility]

## 2015-08-21 ENCOUNTER — Other Ambulatory Visit: Payer: Self-pay

## 2015-08-21 MED ORDER — PAROXETINE HCL 20 MG PO TABS: 20.0000 mg | ORAL_TABLET | Freq: Every day | ORAL | Status: DC

## 2015-08-21 MED ORDER — LEVOTHYROXINE SODIUM 150 MCG PO TABS: ORAL_TABLET | ORAL | Status: DC

## 2015-08-24 ENCOUNTER — Encounter: Payer: Self-pay | Admitting: Nurse Practitioner

## 2015-08-24 ENCOUNTER — Ambulatory Visit (INDEPENDENT_AMBULATORY_CARE_PROVIDER_SITE_OTHER): Payer: BLUE CROSS/BLUE SHIELD | Admitting: Nurse Practitioner

## 2015-08-24 VITALS — BP 118/76 | Ht 65.0 in | Wt 174.0 lb

## 2015-08-24 DIAGNOSIS — F419 Anxiety disorder, unspecified: Secondary | ICD-10-CM

## 2015-08-24 DIAGNOSIS — K219 Gastro-esophageal reflux disease without esophagitis: Secondary | ICD-10-CM

## 2015-08-24 DIAGNOSIS — E039 Hypothyroidism, unspecified: Secondary | ICD-10-CM | POA: Diagnosis not present

## 2015-08-24 NOTE — Progress Notes (Signed)
Subjective:  Presents for routine follow-up. Compliant with medications. Anxiety stable. Uses a rare Xanax. Very active lifestyle. Has done well maintaining her weight loss. Reflux is stable with daily Protonix.  Objective:   BP 118/76 mmHg  Ht 5\' 5"  (1.651 m)  Wt 174 lb (78.926 kg)  BMI 28.96 kg/m2 NAD. Alert, oriented. TMs minimal clear effusion, no erythema. Pharynx clear. Neck supple with minimal adenopathy. Lungs breath sounds mildly distant but clear. Heart regular rate rhythm. Abdomen soft nondistended nontender.  Assessment:  Problem List Items Addressed This Visit      Digestive   Esophageal reflux     Endocrine   Hypothyroidism - Primary     Other   Anxiety     Plan: Discussed potential risk associated with long-term PPI use. Return in about 6 months (around 02/23/2016) for recheck. Repeat lab work at that time. Now the patient has health insurance, consider wellness physical.

## 2015-11-21 ENCOUNTER — Other Ambulatory Visit: Payer: Self-pay | Admitting: Nurse Practitioner

## 2015-11-21 NOTE — Telephone Encounter (Signed)
Ok times one diazepam, ok times six mo paxil

## 2015-12-04 DIAGNOSIS — Z23 Encounter for immunization: Secondary | ICD-10-CM | POA: Diagnosis not present

## 2016-01-28 ENCOUNTER — Other Ambulatory Visit: Payer: Self-pay | Admitting: Family Medicine

## 2016-02-25 ENCOUNTER — Encounter: Payer: Self-pay | Admitting: Nurse Practitioner

## 2016-02-25 ENCOUNTER — Ambulatory Visit (INDEPENDENT_AMBULATORY_CARE_PROVIDER_SITE_OTHER): Payer: BLUE CROSS/BLUE SHIELD | Admitting: Nurse Practitioner

## 2016-02-25 VITALS — BP 122/82 | Ht 65.0 in | Wt 180.4 lb

## 2016-02-25 DIAGNOSIS — R5383 Other fatigue: Secondary | ICD-10-CM | POA: Diagnosis not present

## 2016-02-25 DIAGNOSIS — F419 Anxiety disorder, unspecified: Secondary | ICD-10-CM | POA: Diagnosis not present

## 2016-02-25 DIAGNOSIS — K219 Gastro-esophageal reflux disease without esophagitis: Secondary | ICD-10-CM | POA: Diagnosis not present

## 2016-02-25 DIAGNOSIS — Z13220 Encounter for screening for lipoid disorders: Secondary | ICD-10-CM | POA: Diagnosis not present

## 2016-02-25 DIAGNOSIS — E039 Hypothyroidism, unspecified: Secondary | ICD-10-CM | POA: Diagnosis not present

## 2016-02-25 DIAGNOSIS — Z79899 Other long term (current) drug therapy: Secondary | ICD-10-CM | POA: Diagnosis not present

## 2016-02-25 DIAGNOSIS — G4700 Insomnia, unspecified: Secondary | ICD-10-CM | POA: Diagnosis not present

## 2016-02-25 MED ORDER — PAROXETINE HCL 20 MG PO TABS: ORAL_TABLET | ORAL | 1 refills | Status: DC

## 2016-02-25 NOTE — Progress Notes (Signed)
Subjective:  Presents for routine follow-up. Compliant with medications. Has been trying to quit smoking. Her husband had a CABG recently. He was able to quit smoking. Patient has cut back from one pack per day to a half pack per day. No chest pain/ischemic type pain or shortness of breath. Anxiety and insomnia stable on current medications. Reflux has been stable. Is due for screening blood work. Has not had a physical in a while, now has some insurance.  Objective:   BP 122/82   Ht 5\' 5"  (1.651 m)   Wt 180 lb 6.4 oz (81.8 kg)   BMI 30.02 kg/m  NAD. Alert, oriented. Lungs clear. Heart regular rate rhythm. Lower extremities no edema.  Assessment:  Problem List Items Addressed This Visit      Digestive   Esophageal reflux     Endocrine   Hypothyroidism - Primary   Relevant Orders   TSH     Other   Anxiety   Relevant Medications   PARoxetine (PAXIL) 20 MG tablet   Insomnia    Other Visit Diagnoses    Other fatigue       Relevant Orders   CBC with Differential/Platelet   High risk medication use       Relevant Orders   Basic metabolic panel   Hepatic function panel   CBC with Differential/Platelet   Screening cholesterol level       Relevant Orders   Lipid panel     Plan:  Meds ordered this encounter  Medications  . PARoxetine (PAXIL) 20 MG tablet    Sig: TAKE 1 TABLET(20 MG) BY MOUTH DAILY    Dispense:  90 tablet    Refill:  1    Order Specific Question:   Supervising Provider    Answer:   Maggie Font   Lab work pending. Strongly recommend preventive health physical within the next few months, patient agrees. Return in about 6 months (around 08/25/2016) for recheck.

## 2016-02-26 ENCOUNTER — Other Ambulatory Visit: Payer: Self-pay | Admitting: Nurse Practitioner

## 2016-02-26 ENCOUNTER — Encounter: Payer: Self-pay | Admitting: Nurse Practitioner

## 2016-02-26 LAB — BASIC METABOLIC PANEL
BUN / CREAT RATIO: 16 (ref 9–23)
BUN: 11 mg/dL (ref 6–24)
CALCIUM: 9.8 mg/dL (ref 8.7–10.2)
CHLORIDE: 101 mmol/L (ref 96–106)
CO2: 26 mmol/L (ref 18–29)
CREATININE: 0.67 mg/dL (ref 0.57–1.00)
GFR, EST AFRICAN AMERICAN: 114 mL/min/{1.73_m2} (ref >59)
GFR, EST NON AFRICAN AMERICAN: 99 mL/min/{1.73_m2} (ref >59)
Glucose: 80 mg/dL (ref 65–99)
Potassium: 5 mmol/L (ref 3.5–5.2)
Sodium: 141 mmol/L (ref 134–144)

## 2016-02-26 LAB — LIPID PANEL
Chol/HDL Ratio: 3.5 ratio units (ref 0.0–4.4)
Cholesterol, Total: 188 mg/dL (ref 100–199)
HDL: 54 mg/dL (ref >39)
LDL CALC: 116 mg/dL — AB (ref 0–99)
Triglycerides: 89 mg/dL (ref 0–149)
VLDL CHOLESTEROL CAL: 18 mg/dL (ref 5–40)

## 2016-02-26 LAB — CBC WITH DIFFERENTIAL/PLATELET
BASOS ABS: 0 10*3/uL (ref 0.0–0.2)
BASOS: 1 %
EOS (ABSOLUTE): 0 10*3/uL (ref 0.0–0.4)
Eos: 0 %
Hematocrit: 38.6 % (ref 34.0–46.6)
Hemoglobin: 12 g/dL (ref 11.1–15.9)
IMMATURE GRANS (ABS): 0 10*3/uL (ref 0.0–0.1)
IMMATURE GRANULOCYTES: 0 %
LYMPHS: 34 %
Lymphocytes Absolute: 2.3 10*3/uL (ref 0.7–3.1)
MCH: 24.2 pg — ABNORMAL LOW (ref 26.6–33.0)
MCHC: 31.1 g/dL — ABNORMAL LOW (ref 31.5–35.7)
MCV: 78 fL — AB (ref 79–97)
Monocytes Absolute: 0.5 10*3/uL (ref 0.1–0.9)
Monocytes: 8 %
NEUTROS PCT: 57 %
Neutrophils Absolute: 3.9 10*3/uL (ref 1.4–7.0)
PLATELETS: 261 10*3/uL (ref 150–379)
RBC: 4.96 x10E6/uL (ref 3.77–5.28)
RDW: 16.4 % — AB (ref 12.3–15.4)
WBC: 6.8 10*3/uL (ref 3.4–10.8)

## 2016-02-26 LAB — HEPATIC FUNCTION PANEL
ALBUMIN: 4.5 g/dL (ref 3.5–5.5)
ALT: 16 IU/L (ref 0–32)
AST: 20 IU/L (ref 0–40)
Alkaline Phosphatase: 94 IU/L (ref 39–117)
Bilirubin Total: 0.4 mg/dL (ref 0.0–1.2)
Bilirubin, Direct: 0.11 mg/dL (ref 0.00–0.40)
Total Protein: 7.7 g/dL (ref 6.0–8.5)

## 2016-02-26 LAB — TSH: TSH: 1.18 u[IU]/mL (ref 0.450–4.500)

## 2016-02-26 MED ORDER — LEVOTHYROXINE SODIUM 150 MCG PO TABS: ORAL_TABLET | ORAL | 3 refills | Status: DC

## 2016-04-08 ENCOUNTER — Ambulatory Visit (INDEPENDENT_AMBULATORY_CARE_PROVIDER_SITE_OTHER): Payer: BLUE CROSS/BLUE SHIELD | Admitting: Family Medicine

## 2016-04-08 ENCOUNTER — Encounter: Payer: Self-pay | Admitting: Family Medicine

## 2016-04-08 VITALS — BP 124/76 | Temp 98.6°F | Ht 65.0 in | Wt 183.0 lb

## 2016-04-08 DIAGNOSIS — J209 Acute bronchitis, unspecified: Secondary | ICD-10-CM | POA: Diagnosis not present

## 2016-04-08 DIAGNOSIS — J329 Chronic sinusitis, unspecified: Secondary | ICD-10-CM | POA: Diagnosis not present

## 2016-04-08 MED ORDER — AMOXICILLIN-POT CLAVULANATE 875-125 MG PO TABS: 1.0000 | ORAL_TABLET | Freq: Two times a day (BID) | ORAL | 0 refills | Status: AC

## 2016-04-08 MED ORDER — ALBUTEROL SULFATE HFA 108 (90 BASE) MCG/ACT IN AERS: 2.0000 | INHALATION_SPRAY | RESPIRATORY_TRACT | 5 refills | Status: DC | PRN

## 2016-04-08 NOTE — Progress Notes (Signed)
   Subjective:    Patient ID: NIGELLA BROOKINGS, female    DOB: 24-Oct-1960, 56 y.o.   MRN: [id number]  Sinusitis  This is a new problem. Episode onset: two and a half weeks. Associated symptoms include congestion and coughing. Treatments tried: mucinex, albuterol.   Frontal headache   Cough perwistent  Cough worse at night  wheezin at times  Others in family with sickness    wheezy or tight texture prn albut has helped  muc dm not helpin much   Need more alb    Review of Systems  HENT: Positive for congestion.   Respiratory: Positive for cough.        Objective:   Physical Exam Alert, mild malaise. Hydration good Vitals stable. frontal/ maxillary tenderness evident positive nasal congestion. pharynx normal neck supple  lungs clear/no crackles or wheezes. heart regular in rhythm        Assessment & Plan:  Impression rhinosinusitis likely post viral, discussed with patient. plan antibiotics prescribed. Questions answered. Symptomatic care discussed. warning signs discussed. WSLAdd albuterol when necessary for reactive airway component encouraged strongly to stop smoking

## 2016-05-02 ENCOUNTER — Other Ambulatory Visit: Payer: Self-pay | Admitting: Family Medicine

## 2016-08-19 ENCOUNTER — Other Ambulatory Visit: Payer: Self-pay | Admitting: Nurse Practitioner

## 2016-08-25 ENCOUNTER — Other Ambulatory Visit: Payer: Self-pay | Admitting: Nurse Practitioner

## 2016-08-25 ENCOUNTER — Encounter: Payer: Self-pay | Admitting: Nurse Practitioner

## 2016-08-25 ENCOUNTER — Ambulatory Visit (INDEPENDENT_AMBULATORY_CARE_PROVIDER_SITE_OTHER): Payer: BLUE CROSS/BLUE SHIELD | Admitting: Nurse Practitioner

## 2016-08-25 VITALS — BP 118/72 | Ht 62.75 in | Wt 184.0 lb

## 2016-08-25 DIAGNOSIS — Z1231 Encounter for screening mammogram for malignant neoplasm of breast: Secondary | ICD-10-CM | POA: Diagnosis not present

## 2016-08-25 DIAGNOSIS — Z1151 Encounter for screening for human papillomavirus (HPV): Secondary | ICD-10-CM

## 2016-08-25 DIAGNOSIS — Z124 Encounter for screening for malignant neoplasm of cervix: Secondary | ICD-10-CM

## 2016-08-25 DIAGNOSIS — Z0000 Encounter for general adult medical examination without abnormal findings: Secondary | ICD-10-CM | POA: Diagnosis not present

## 2016-08-25 DIAGNOSIS — K469 Unspecified abdominal hernia without obstruction or gangrene: Secondary | ICD-10-CM | POA: Insufficient documentation

## 2016-08-25 DIAGNOSIS — K458 Other specified abdominal hernia without obstruction or gangrene: Secondary | ICD-10-CM | POA: Insufficient documentation

## 2016-08-25 MED ORDER — DIAZEPAM 10 MG PO TABS: ORAL_TABLET | ORAL | 1 refills | Status: DC

## 2016-08-25 NOTE — Progress Notes (Signed)
   Subjective:    Patient ID: Raven Ferguson, female    DOB: [birth year], 56 y.o.   MRN: [id number]  HPI presents for her wellness exam. No menses for at least 5 years. No vaginal bleeding or pelvic pain. Same sexual partner. Regular vision exams. Has partial plates. No dental unless a problem. No mouth sores or lesions. Has cut back her smoking to less than 1/2 ppd. Uses rare Valium only when needed. Very active. Doing well maintaining her weight.     Review of Systems  Constitutional: Negative for activity change, appetite change and fatigue.  HENT: Negative for dental problem, ear pain, sinus pressure and sore throat.   Respiratory: Negative for cough, chest tightness, shortness of breath and wheezing.   Cardiovascular: Negative for chest pain.  Gastrointestinal: Negative for abdominal distention, abdominal pain, blood in stool, constipation, diarrhea, nausea and vomiting.  Genitourinary: Negative for difficulty urinating, dysuria, enuresis, frequency, genital sores, pelvic pain, urgency, vaginal bleeding and vaginal discharge.       Objective:   Physical Exam  Constitutional: She is oriented to person, place, and time. She appears well-developed. No distress.  HENT:  Right Ear: External ear normal.  Left Ear: External ear normal.  Mouth/Throat: Oropharynx is clear and moist.  Neck: Normal range of motion. Neck supple. No tracheal deviation present. No thyromegaly present.  Cardiovascular: Normal rate, regular rhythm and normal heart sounds.  Exam reveals no gallop.   No murmur heard. Significant varicose veins noted lower extremities.  Pulmonary/Chest: Effort normal and breath sounds normal.  Abdominal: Soft. She exhibits mass. She exhibits no distension. There is no tenderness.  Large reducible nontender hernia noted mid upper abd.   Genitourinary: Vagina normal and uterus normal. No vaginal discharge found.  Genitourinary Comments: External GU: no rashes or lesion. Vagina: pale  slight amount mucoid discharge. Cervix normal in appearance. No CMT. Bimanual exam: no tenderness or obvious masses. Limited due to abd girth and significant scarring from previous surgeries.   Musculoskeletal: She exhibits no edema.  Lymphadenopathy:    She has no cervical adenopathy.  Neurological: She is alert and oriented to person, place, and time.  Skin: Skin is warm and dry. Rash noted.  A few flat warts noted left lower anterior leg.   Psychiatric: She has a normal mood and affect. Her behavior is normal.  Vitals reviewed. Breast exam: no masses; axillae no adenopathy.         Assessment & Plan:  Routine general medical examination at a health care facility - Plan: Pap IG and HPV (high risk) DNA detection  Screening for cervical cancer - Plan: Pap IG and HPV (high risk) DNA detection  Screening for HPV (human papillomavirus) - Plan: Pap IG and HPV (high risk) DNA detection  Encounter for screening mammogram for breast cancer - Plan: MM DIGITAL SCREENING BILATERAL  Encouraged vitamin D supplement. Warning signs reviewed regarding hernia. Call or go to ED if any problems. Defers surgical consultation at this time.  Return in about 6 months (around 02/24/2017) for recheck. Yearly labs at that time.

## 2016-08-27 LAB — PAP IG AND HPV HIGH-RISK
HPV, high-risk: NEGATIVE
PAP SMEAR COMMENT: 0

## 2016-09-08 ENCOUNTER — Ambulatory Visit (HOSPITAL_COMMUNITY): Payer: Self-pay

## 2016-09-10 ENCOUNTER — Ambulatory Visit (HOSPITAL_COMMUNITY)
Admission: RE | Admit: 2016-09-10 | Discharge: 2016-09-10 | Disposition: A | Discharge status: Home / Self Care | Payer: BLUE CROSS/BLUE SHIELD | Source: Ambulatory Visit | Attending: Nurse Practitioner | Admitting: Nurse Practitioner

## 2016-09-10 DIAGNOSIS — Z1231 Encounter for screening mammogram for malignant neoplasm of breast: Secondary | ICD-10-CM

## 2016-11-07 ENCOUNTER — Other Ambulatory Visit: Payer: Self-pay | Admitting: Family Medicine

## 2017-01-07 ENCOUNTER — Ambulatory Visit (INDEPENDENT_AMBULATORY_CARE_PROVIDER_SITE_OTHER): Payer: BLUE CROSS/BLUE SHIELD | Admitting: Family Medicine

## 2017-01-07 ENCOUNTER — Encounter: Payer: Self-pay | Admitting: Family Medicine

## 2017-01-07 ENCOUNTER — Ambulatory Visit (HOSPITAL_COMMUNITY)
Admission: RE | Admit: 2017-01-07 | Discharge: 2017-01-07 | Disposition: A | Discharge status: Home / Self Care | Payer: BLUE CROSS/BLUE SHIELD | Source: Ambulatory Visit | Attending: Family Medicine | Admitting: Family Medicine

## 2017-01-07 VITALS — BP 154/86 | HR 55 | Temp 98.4°F | Ht 62.75 in | Wt 181.0 lb

## 2017-01-07 DIAGNOSIS — R05 Cough: Secondary | ICD-10-CM

## 2017-01-07 DIAGNOSIS — J019 Acute sinusitis, unspecified: Secondary | ICD-10-CM

## 2017-01-07 DIAGNOSIS — J209 Acute bronchitis, unspecified: Secondary | ICD-10-CM

## 2017-01-07 DIAGNOSIS — R053 Chronic cough: Secondary | ICD-10-CM

## 2017-01-07 DIAGNOSIS — J301 Allergic rhinitis due to pollen: Secondary | ICD-10-CM

## 2017-01-07 MED ORDER — AMOXICILLIN-POT CLAVULANATE 875-125 MG PO TABS: 1.0000 | ORAL_TABLET | Freq: Two times a day (BID) | ORAL | 0 refills | Status: DC

## 2017-01-07 MED ORDER — PREDNISONE 20 MG PO TABS: ORAL_TABLET | ORAL | 0 refills | Status: DC

## 2017-01-07 NOTE — Progress Notes (Signed)
   Subjective:    Patient ID: Raven Ferguson, female    DOB: Aug 09, 1960, 56 y.o.   MRN: [id number]  Sinusitis  Associated symptoms include congestion and coughing. Pertinent negatives include no ear pain or shortness of breath.  Patient comes in today stating she had some sneezing and productive cough for the last four weeks. She states she thought at first it was a reaction rag weed/allergies. She occasionally has sinus head. Has been taking Mucinex Dm, and Aleve, which have helped some. She is a smoker and states she is trying to quit. She is using her inhaler as well. O2 98% Review of Systems  Constitutional: Negative for activity change and fever.  HENT: Positive for congestion and rhinorrhea. Negative for ear pain.   Eyes: Negative for discharge.  Respiratory: Positive for cough. Negative for shortness of breath and wheezing.   Cardiovascular: Negative for chest pain.   She was a greater than 2 pack-a-day smoker she is gotten all the way down to just 3-4 cigarettes a day she quit    Objective:   Physical Exam  Constitutional: She appears well-developed.  HENT:  Head: Normocephalic.  Right Ear: External ear normal.  Left Ear: External ear normal.  Nose: Nose normal.  Mouth/Throat: Oropharynx is clear and moist. No oropharyngeal exudate.  Eyes: Right eye exhibits no discharge. Left eye exhibits no discharge.  Neck: Neck supple. No tracheal deviation present.  Cardiovascular: Normal rate and normal heart sounds.   No murmur heard. Pulmonary/Chest: Effort normal and breath sounds normal. She has no wheezes. She has no rales.  Lymphadenopathy:    She has no cervical adenopathy.  Skin: Skin is warm and dry.  Nursing note and vitals reviewed.  She does not appear toxic no respiratory distress       Assessment & Plan:  Probable chronic bronchitis possibility of developing into COPD Encourage patient to quit smoking she is trying Antibiotics prescribed for bronchitis Chest x-ray  ordered because of chronic cough Albuterol when necessary

## 2017-01-08 ENCOUNTER — Other Ambulatory Visit: Payer: Self-pay

## 2017-01-08 MED ORDER — AMOXICILLIN-POT CLAVULANATE 875-125 MG PO TABS: 1.0000 | ORAL_TABLET | Freq: Two times a day (BID) | ORAL | 0 refills | Status: DC

## 2017-01-08 MED ORDER — PREDNISONE 20 MG PO TABS: ORAL_TABLET | ORAL | 0 refills | Status: DC

## 2017-02-24 ENCOUNTER — Encounter: Payer: Self-pay | Admitting: Nurse Practitioner

## 2017-02-24 ENCOUNTER — Ambulatory Visit: Payer: BLUE CROSS/BLUE SHIELD | Admitting: Nurse Practitioner

## 2017-02-24 VITALS — BP 132/82 | Ht 62.75 in | Wt 180.4 lb

## 2017-02-24 DIAGNOSIS — E039 Hypothyroidism, unspecified: Secondary | ICD-10-CM | POA: Diagnosis not present

## 2017-02-24 DIAGNOSIS — Z13220 Encounter for screening for lipoid disorders: Secondary | ICD-10-CM | POA: Diagnosis not present

## 2017-02-24 DIAGNOSIS — J310 Chronic rhinitis: Secondary | ICD-10-CM

## 2017-02-24 DIAGNOSIS — K219 Gastro-esophageal reflux disease without esophagitis: Secondary | ICD-10-CM

## 2017-02-24 DIAGNOSIS — F419 Anxiety disorder, unspecified: Secondary | ICD-10-CM | POA: Diagnosis not present

## 2017-02-24 DIAGNOSIS — R5383 Other fatigue: Secondary | ICD-10-CM | POA: Diagnosis not present

## 2017-02-24 DIAGNOSIS — Z79899 Other long term (current) drug therapy: Secondary | ICD-10-CM | POA: Diagnosis not present

## 2017-02-24 MED ORDER — PAROXETINE HCL 20 MG PO TABS: ORAL_TABLET | ORAL | 1 refills | Status: DC

## 2017-02-24 NOTE — Patient Instructions (Signed)
Nasacort AQ Antihistamine

## 2017-02-24 NOTE — Progress Notes (Addendum)
Subjective:  Presents for recheck on her chronic health issues. Has almost quit smoking; continues to wean herself. Compliant with meds. Rare use of Valium; still has most of the first Rx; has not gotten refill. Cough improved after last visit. Came back again recently after raking and burning leaves. Non productive. PND. No fever. Scratchy throat at times. No fever. No wheezing, has not used her albuterol inhaler. Itching in the ears. No headache. Anxiety stable on Paxil. Takes Protonix daily which controls her "sick feeling" in her stomach. No difficulty swallowing.   Objective:   BP 132/82   Ht 5' 2.75" (1.594 m)   Wt 180 lb 6.4 oz (81.8 kg)   BMI 32.21 kg/m  NAD. Alert, oriented. Thyroid fine nodularity but no mass or goiter; non tender.  TMs clear effusion, no erythema.  Pharynx clear.  Neck supple with minimal adenopathy.  Lungs clear.  Heart regular rate and rhythm.  Abdomen soft nondistended nontender.  Chest x-ray dated 01/07/2017 was normal.  Assessment:   Problem List Items Addressed This Visit      Digestive   Esophageal reflux     Endocrine   Hypothyroidism - Primary   Relevant Orders   TSH     Other   Anxiety   Relevant Medications   PARoxetine (PAXIL) 20 MG tablet    Other Visit Diagnoses    Fatigue, unspecified type       Relevant Orders   CBC with Differential/Platelet   VITAMIN D 25 Hydroxy (Vit-D Deficiency, Fractures)   High risk medication use       Relevant Orders   Basic metabolic panel   Lipid panel   Hepatic function panel   Screening, lipid       Mixed rhinitis           Plan:   Meds ordered this encounter  Medications  . PARoxetine (PAXIL) 20 MG tablet    Sig: TAKE 1 TABLET(20 MG) BY MOUTH DAILY    Dispense:  90 tablet    Refill:  1    Order Specific Question:   Supervising Provider    Answer:   Mikey Kirschner [[id number]]   Continue current medications as directed.  Lab work pending.  Encouraged regular activity and daily vitamin D/calcium.   OTC antihistamine and steroid nasal spray as directed.  Call back if worsens or persist.  Otherwise recheck in 6 months at her physical.  Reviewed potential risks associated with chronic PPI use.

## 2017-02-25 LAB — BASIC METABOLIC PANEL
BUN/Creatinine Ratio: 13 (ref 9–23)
BUN: 9 mg/dL (ref 6–24)
CO2: 26 mmol/L (ref 20–29)
CREATININE: 0.68 mg/dL (ref 0.57–1.00)
Calcium: 9.3 mg/dL (ref 8.7–10.2)
Chloride: 103 mmol/L (ref 96–106)
GFR calc non Af Amer: 98 mL/min/{1.73_m2} (ref >59)
GFR, EST AFRICAN AMERICAN: 113 mL/min/{1.73_m2} (ref >59)
GLUCOSE: 89 mg/dL (ref 65–99)
Potassium: 4.9 mmol/L (ref 3.5–5.2)
Sodium: 140 mmol/L (ref 134–144)

## 2017-02-25 LAB — CBC WITH DIFFERENTIAL/PLATELET
BASOS ABS: 0 10*3/uL (ref 0.0–0.2)
Basos: 1 %
EOS (ABSOLUTE): 0.1 10*3/uL (ref 0.0–0.4)
Eos: 1 %
HEMOGLOBIN: 11.1 g/dL (ref 11.1–15.9)
Hematocrit: 37.6 % (ref 34.0–46.6)
Immature Grans (Abs): 0 10*3/uL (ref 0.0–0.1)
Immature Granulocytes: 0 %
LYMPHS ABS: 1.5 10*3/uL (ref 0.7–3.1)
LYMPHS: 19 %
MCH: 22.8 pg — AB (ref 26.6–33.0)
MCHC: 29.5 g/dL — AB (ref 31.5–35.7)
MCV: 77 fL — ABNORMAL LOW (ref 79–97)
MONOCYTES: 10 %
Monocytes Absolute: 0.7 10*3/uL (ref 0.1–0.9)
NEUTROS ABS: 5.3 10*3/uL (ref 1.4–7.0)
Neutrophils: 69 %
PLATELETS: 257 10*3/uL (ref 150–379)
RBC: 4.87 x10E6/uL (ref 3.77–5.28)
RDW: 17.2 % — ABNORMAL HIGH (ref 12.3–15.4)
WBC: 7.7 10*3/uL (ref 3.4–10.8)

## 2017-02-25 LAB — HEPATIC FUNCTION PANEL
ALT: 10 IU/L (ref 0–32)
AST: 20 IU/L (ref 0–40)
Albumin: 4.2 g/dL (ref 3.5–5.5)
Alkaline Phosphatase: 82 IU/L (ref 39–117)
Bilirubin Total: 0.4 mg/dL (ref 0.0–1.2)
Bilirubin, Direct: 0.1 mg/dL (ref 0.00–0.40)
TOTAL PROTEIN: 7.3 g/dL (ref 6.0–8.5)

## 2017-02-25 LAB — LIPID PANEL
CHOL/HDL RATIO: 2.8 ratio (ref 0.0–4.4)
Cholesterol, Total: 142 mg/dL (ref 100–199)
HDL: 50 mg/dL (ref >39)
LDL CALC: 71 mg/dL (ref 0–99)
Triglycerides: 104 mg/dL (ref 0–149)
VLDL CHOLESTEROL CAL: 21 mg/dL (ref 5–40)

## 2017-02-25 LAB — VITAMIN D 25 HYDROXY (VIT D DEFICIENCY, FRACTURES): VIT D 25 HYDROXY: 13.8 ng/mL — AB (ref 30.0–100.0)

## 2017-02-25 LAB — TSH: TSH: 0.28 u[IU]/mL — AB (ref 0.450–4.500)

## 2017-02-27 ENCOUNTER — Other Ambulatory Visit: Payer: Self-pay | Admitting: Nurse Practitioner

## 2017-02-27 ENCOUNTER — Telehealth: Payer: Self-pay | Admitting: Nurse Practitioner

## 2017-02-27 ENCOUNTER — Other Ambulatory Visit: Payer: Self-pay

## 2017-02-27 MED ORDER — AZITHROMYCIN 250 MG PO TABS: ORAL_TABLET | ORAL | 0 refills | Status: DC

## 2017-02-27 NOTE — Telephone Encounter (Signed)
Patient seen Raven Ferguson on 02/24/17.  She had cold symptoms at the time and was told to call back if she felt she needed an antibiotic.  She said that her cold has progressed and would like the antibiotic called in.  [facility]

## 2017-02-27 NOTE — Telephone Encounter (Signed)
Pt is aware.  

## 2017-02-27 NOTE — Telephone Encounter (Signed)
Sent Zpack in to [facility]. Call back if persists.

## 2017-03-24 ENCOUNTER — Other Ambulatory Visit: Payer: Self-pay | Admitting: Nurse Practitioner

## 2017-03-24 MED ORDER — VITAMIN D (ERGOCALCIFEROL) 1.25 MG (50000 UNIT) PO CAPS: 50000.0000 [IU] | ORAL_CAPSULE | ORAL | 2 refills | Status: DC

## 2017-04-01 ENCOUNTER — Telehealth: Payer: Self-pay

## 2017-04-01 NOTE — Telephone Encounter (Signed)
Raven Ferguson dentist here in [place] states the pt came in today for extractions and mentioned to him she had anemia. He called to see what latest hgb was it was 11.1 02/24/2017. I asked Dr. Mickie Hillier if the extractions would be ok with a hgb in this range and he states yes.Dr.Hardesty made aware.

## 2017-04-24 ENCOUNTER — Other Ambulatory Visit: Payer: Self-pay | Admitting: Family Medicine

## 2017-04-27 DIAGNOSIS — C44629 Squamous cell carcinoma of skin of left upper limb, including shoulder: Secondary | ICD-10-CM | POA: Diagnosis not present

## 2017-05-07 ENCOUNTER — Other Ambulatory Visit: Payer: Self-pay | Admitting: Nurse Practitioner

## 2017-05-07 ENCOUNTER — Telehealth: Payer: Self-pay | Admitting: Family Medicine

## 2017-05-07 MED ORDER — PAROXETINE HCL 20 MG PO TABS: ORAL_TABLET | ORAL | 1 refills | Status: DC

## 2017-05-07 NOTE — Telephone Encounter (Signed)
Done

## 2017-05-07 NOTE — Telephone Encounter (Signed)
Patient is prescribed Paxil 20 mg 1 time daily by Hoyle Sauer.  She said Hoyle Sauer told her to take 2 a day if needed, which she has been doing.  She has 2 days left on this medication.  She is needing a new Rx sent to [facility] that reflects 2 times a day.

## 2017-05-18 ENCOUNTER — Ambulatory Visit: Payer: BLUE CROSS/BLUE SHIELD | Admitting: Family Medicine

## 2017-05-18 ENCOUNTER — Encounter: Payer: Self-pay | Admitting: Family Medicine

## 2017-05-18 VITALS — BP 130/82 | Temp 98.4°F | Ht 62.25 in | Wt 190.4 lb

## 2017-05-18 DIAGNOSIS — L02412 Cutaneous abscess of left axilla: Secondary | ICD-10-CM | POA: Diagnosis not present

## 2017-05-18 DIAGNOSIS — L02414 Cutaneous abscess of left upper limb: Secondary | ICD-10-CM | POA: Diagnosis not present

## 2017-05-18 DIAGNOSIS — L02419 Cutaneous abscess of limb, unspecified: Secondary | ICD-10-CM

## 2017-05-18 MED ORDER — DOXYCYCLINE HYCLATE 100 MG PO TABS: 100.0000 mg | ORAL_TABLET | Freq: Two times a day (BID) | ORAL | 0 refills | Status: DC

## 2017-05-18 NOTE — Progress Notes (Signed)
   Subjective:    Patient ID: Raven Ferguson, female    DOB: March 30, 1960, 57 y.o.   MRN: [id number]  HPI Patient arrives with sore under arm since Friday  Patient has history of boils and abscesses in the left axillary region.  Felt the started now spread tender.  No fever no chills no nausea no vomiting   Review of Systems See above    Objective:   Physical Exam  Alert vitals stable, NAD. Blood pressure good on repeat. HEENT normal. Lungs clear. Heart regular rate and rhythm. Left axillary region palpable erythematous firm region with central fluctuance.  Patient was prepped draped and anesthetized.  Incision performed.  Fair amount of pus expressed.  Wound explored.  Drain inserted.     Assessment & Plan:  Impression I&D of abscess.  Some family history of MRSA will cover with doxycycline 1 signs discussed carefully care discussed

## 2017-07-07 ENCOUNTER — Ambulatory Visit (INDEPENDENT_AMBULATORY_CARE_PROVIDER_SITE_OTHER): Payer: BLUE CROSS/BLUE SHIELD

## 2017-07-07 ENCOUNTER — Ambulatory Visit: Payer: BLUE CROSS/BLUE SHIELD | Admitting: Orthopedic Surgery

## 2017-07-07 VITALS — BP 129/89 | HR 61 | Ht 62.0 in | Wt 181.0 lb

## 2017-07-07 DIAGNOSIS — M25561 Pain in right knee: Secondary | ICD-10-CM

## 2017-07-07 DIAGNOSIS — M1611 Unilateral primary osteoarthritis, right hip: Secondary | ICD-10-CM

## 2017-07-07 MED ORDER — DICLOFENAC SODIUM 75 MG PO TBEC: 75.0000 mg | DELAYED_RELEASE_TABLET | Freq: Two times a day (BID) | ORAL | 2 refills | Status: DC

## 2017-07-07 NOTE — Progress Notes (Signed)
NEW PATIENT OFFICE VISIT   Chief Complaint  Patient presents with  . Knee Pain    Right knee pain, no injury.    57 yo presents for evaluation of right knee pain  The pain is located on the medial side of the right knee joint quality dull severity mild it was severe until she took some diclofenac duration a month worse with weightbearing it is improving after 75 mg diclofenac twice a day   Review of Systems  Constitutional: Negative.   Skin: Negative.   Neurological: Negative.      Past Medical History:  Diagnosis Date  . Hypothyroidism   . Iron deficiency anemia     Past Surgical History:  Procedure Laterality Date  . CESAREAN SECTION    . COLONOSCOPY WITH ESOPHAGOGASTRODUODENOSCOPY (EGD) N/A 12/02/2012   DXI:PJASNK post bariatric surgery. Abnormal anastomosis of doubtful clinical significance-status post biopsy (benign)/Colonic diverticulosis. Single colonic polyp-removed. Bx benign. Next TCS 11/2022.  Marland Kitchen GASTRIC BYPASS  2004  . skin reduction     with hernia repair    Family History  Problem Relation Age of Onset  . Hyperlipidemia Father   . Cancer Maternal Aunt        breast  . Diabetes Paternal Grandmother   . Heart attack Paternal Grandmother   . Colon cancer Neg Hx    Social History   Tobacco Use  . Smoking status: Current Every Day Smoker    Packs/day: 1.00    Years: 35.00    Pack years: 35.00    Types: Cigarettes    Start date: 04/10/1976  . Smokeless tobacco: Never Used  Substance Use Topics  . Alcohol use: No    Alcohol/week: 0.0 oz  . Drug use: No    No outpatient medications have been marked as taking for the 07/07/17 encounter (Office Visit) with Carole Civil, MD.    BP 129/89   Pulse 61   Ht 5\' 2"  (1.575 m)   Wt 181 lb (82.1 kg)   BMI 33.11 kg/m   Physical Exam  Constitutional: She is oriented to person, place, and time. She appears well-developed and well-nourished.  Musculoskeletal:       Right knee: She exhibits no  effusion.       Left knee: She exhibits no effusion.  Neurological: She is alert and oriented to person, place, and time.  Psychiatric: She has a normal mood and affect. Judgment normal.  Vitals reviewed.   Right Knee Exam   Muscle Strength  The patient has normal right knee strength.  Tenderness  The patient is experiencing tenderness in the medial joint line.  Range of Motion  Extension: normal  Flexion: normal   Tests  McMurray:  Medial - negative Lateral - negative Varus: negative Valgus: negative Drawer:  Anterior - negative    Posterior - negative  Other  Erythema: absent Scars: absent Sensation: normal Pulse: present Swelling: none Effusion: no effusion present   Left Knee Exam   Muscle Strength  The patient has normal left knee strength.  Tenderness  The patient is experiencing no tenderness.   Range of Motion  Extension: normal  Flexion: normal   Tests  McMurray:  Medial - negative Lateral - negative Varus: negative Valgus: negative Drawer:  Anterior - negative     Posterior - negative  Other  Erythema: absent Scars: absent Sensation: normal Pulse: present Swelling: none Effusion: no effusion present      MEDICAL DECISION SECTION  xrays ordered? yes  My  independent reading of xrays: See dictated report  The x-ray shows varus alignment 1-2 degrees medial compartment narrowing grade 4 bone-on-bone   Encounter Diagnoses  Name Primary?  . Right knee pain, unspecified chronicity   . Primary osteoarthritis of right hip Yes   Recommend continue diclofenac  PLAN:   Meds ordered this encounter  Medications  . diclofenac (VOLTAREN) 75 MG EC tablet    Sig: Take 1 tablet (75 mg total) by mouth 2 (two) times daily with a meal.    Dispense:  60 tablet    Refill:  2   X-ray in 6 months

## 2017-08-22 ENCOUNTER — Other Ambulatory Visit: Payer: Self-pay | Admitting: Nurse Practitioner

## 2017-08-26 ENCOUNTER — Ambulatory Visit (INDEPENDENT_AMBULATORY_CARE_PROVIDER_SITE_OTHER): Payer: BLUE CROSS/BLUE SHIELD | Admitting: Nurse Practitioner

## 2017-08-26 ENCOUNTER — Encounter: Payer: Self-pay | Admitting: Nurse Practitioner

## 2017-08-26 ENCOUNTER — Other Ambulatory Visit: Payer: Self-pay | Admitting: Nurse Practitioner

## 2017-08-26 VITALS — BP 136/84 | Ht 62.75 in | Wt 182.0 lb

## 2017-08-26 DIAGNOSIS — D508 Other iron deficiency anemias: Secondary | ICD-10-CM | POA: Diagnosis not present

## 2017-08-26 DIAGNOSIS — Z0001 Encounter for general adult medical examination with abnormal findings: Secondary | ICD-10-CM

## 2017-08-26 DIAGNOSIS — R600 Localized edema: Secondary | ICD-10-CM | POA: Diagnosis not present

## 2017-08-26 DIAGNOSIS — Z1231 Encounter for screening mammogram for malignant neoplasm of breast: Secondary | ICD-10-CM

## 2017-08-26 DIAGNOSIS — Z780 Asymptomatic menopausal state: Secondary | ICD-10-CM

## 2017-08-26 DIAGNOSIS — E559 Vitamin D deficiency, unspecified: Secondary | ICD-10-CM

## 2017-08-26 DIAGNOSIS — Z87891 Personal history of nicotine dependence: Secondary | ICD-10-CM | POA: Diagnosis not present

## 2017-08-26 DIAGNOSIS — Z0000 Encounter for general adult medical examination without abnormal findings: Secondary | ICD-10-CM

## 2017-08-26 DIAGNOSIS — E039 Hypothyroidism, unspecified: Secondary | ICD-10-CM

## 2017-08-27 ENCOUNTER — Telehealth: Payer: Self-pay

## 2017-08-27 NOTE — Progress Notes (Signed)
Looking in chart, looks like she has not had it done at this time.

## 2017-08-27 NOTE — Telephone Encounter (Signed)
Patient had lower ext [place] scheduled for 08/26/2017. She no showed per [facility]. I called the home # and let a message asking her to call. I called the alt # in the chart and the Husband answered and he states he is not with her currently,but he knew she did not show up for that xray, when I asked why she would do that he stated she had been down lately.I asked that he would please have the pt to r/c. So we can discuss this.

## 2017-08-28 ENCOUNTER — Encounter: Payer: Self-pay | Admitting: Nurse Practitioner

## 2017-08-28 ENCOUNTER — Other Ambulatory Visit: Payer: Self-pay | Admitting: Nurse Practitioner

## 2017-08-28 ENCOUNTER — Ambulatory Visit (HOSPITAL_COMMUNITY)
Admission: RE | Admit: 2017-08-28 | Discharge: 2017-08-28 | Disposition: A | Discharge status: Home / Self Care | Payer: BLUE CROSS/BLUE SHIELD | Source: Ambulatory Visit | Attending: Nurse Practitioner | Admitting: Nurse Practitioner

## 2017-08-28 ENCOUNTER — Telehealth: Payer: Self-pay | Admitting: *Deleted

## 2017-08-28 DIAGNOSIS — E039 Hypothyroidism, unspecified: Secondary | ICD-10-CM | POA: Diagnosis not present

## 2017-08-28 DIAGNOSIS — Z0000 Encounter for general adult medical examination without abnormal findings: Secondary | ICD-10-CM | POA: Diagnosis not present

## 2017-08-28 DIAGNOSIS — D508 Other iron deficiency anemias: Secondary | ICD-10-CM | POA: Diagnosis not present

## 2017-08-28 DIAGNOSIS — R600 Localized edema: Secondary | ICD-10-CM | POA: Diagnosis not present

## 2017-08-28 DIAGNOSIS — E559 Vitamin D deficiency, unspecified: Secondary | ICD-10-CM | POA: Diagnosis not present

## 2017-08-28 DIAGNOSIS — I83891 Varicose veins of right lower extremities with other complications: Secondary | ICD-10-CM | POA: Diagnosis not present

## 2017-08-28 MED ORDER — VITAMIN D (ERGOCALCIFEROL) 1.25 MG (50000 UNIT) PO CAPS: 50000.0000 [IU] | ORAL_CAPSULE | ORAL | 2 refills | Status: DC

## 2017-08-28 NOTE — Telephone Encounter (Signed)
Done

## 2017-08-28 NOTE — Telephone Encounter (Signed)
Patient called stating [facility] on [address] has not received her vitamin D prescription. Patient asked for a return call.

## 2017-08-28 NOTE — Telephone Encounter (Signed)
Noted  

## 2017-08-28 NOTE — Telephone Encounter (Signed)
Left message to return call 

## 2017-08-28 NOTE — Progress Notes (Signed)
Subjective:    Patient ID: Raven Ferguson, female    DOB: 28-Sep-1960, 57 y.o.   MRN: [id number]  HPI presents for her wellness exam.  Has not been eating very healthy.  Very active.  No vaginal bleeding.  Same sexual partner.  Regular vision and dental exams.  Continues to smoke at least a pack per day.  Is willing to do the low-dose CT scan of the lungs if her insurance will cover.  Denies any hemoptysis or unexplained weight loss.  Is willing to undergo biopsies or surgery if needed.  Has had a "knot" in the right lower leg for the past few days.  Mildly tender.  No edema or shortness of breath.  No chest pain.  States her depression is stable on Paxil.  Pantoprazole is controlling her acid reflux disease.    Review of Systems  Constitutional: Positive for fatigue. Negative for activity change and appetite change.  HENT: Negative for dental problem, ear pain, sinus pressure and sore throat.   Respiratory: Negative for cough, chest tightness, shortness of breath and wheezing.   Cardiovascular: Negative for chest pain.  Gastrointestinal: Negative for abdominal distention, abdominal pain, blood in stool, constipation, diarrhea, nausea and vomiting.  Genitourinary: Negative for difficulty urinating, dysuria, enuresis, frequency, genital sores, menstrual problem, pelvic pain, urgency, vaginal bleeding and vaginal discharge.       Objective:   Physical Exam  Constitutional: She is oriented to person, place, and time. She appears well-developed. No distress.  HENT:  Right Ear: External ear normal.  Left Ear: External ear normal.  Mouth/Throat: Oropharynx is clear and moist.  Neck: Normal range of motion. Neck supple. No tracheal deviation present. No thyromegaly present.  Cardiovascular: Normal rate, regular rhythm and normal heart sounds. Exam reveals no gallop.  No murmur heard. Significant large varicosities noted in both legs.  A firm slightly tender faintly pink nodule noted in the mid  right pretibial area.  Mild edema lower extremities bilaterally.  Pulmonary/Chest: Effort normal and breath sounds normal. Right breast exhibits no inverted nipple, no mass, no skin change and no tenderness. Left breast exhibits no inverted nipple, no mass, no skin change and no tenderness. Breasts are symmetrical.  Axillae no adenopathy.  Abdominal: Soft. She exhibits no distension. There is no tenderness.  Genitourinary: Vagina normal and uterus normal. No vaginal discharge found.  Genitourinary Comments: External GU: No rashes or lesions.  Vagina no discharge.  Cervix normal limit in appearance.  No CMT.  Bimanual exam no tenderness or obvious masses.  Musculoskeletal: She exhibits no edema.  Lymphadenopathy:    She has no cervical adenopathy.  Neurological: She is alert and oriented to person, place, and time.  Skin: Skin is warm and dry. No rash noted.  Psychiatric: She has a normal mood and affect. Her behavior is normal.          Assessment & Plan:   Problem List Items Addressed This Visit      Endocrine   Hypothyroidism   Relevant Orders   TSH    Other Visit Diagnoses    Annual physical exam    -  Primary   Relevant Orders   Vitamin D (25 hydroxy)   TSH   CBC with Differential   Vitamin D deficiency       Relevant Orders   Vitamin D (25 hydroxy)   Other iron deficiency anemia       Relevant Orders   CBC with Differential   Leg edema  Relevant Orders   US Venous Img Lower Unilateral Right (Completed)   Postmenopausal       Relevant Orders   DG Bone Density   Personal history of tobacco use, presenting hazards to health         Discussed importance of smoking cessation.  Recommend increase activity.  Lab work and ultrasound pending.  Will refer for possible low-dose CT scan of the lungs. Return in about 6 months (around 02/25/2018) for recheck.

## 2017-08-28 NOTE — Telephone Encounter (Signed)
Pt returned call. Pt informed that vitamin d was sent in. Pt verbalized understanding

## 2017-08-29 ENCOUNTER — Other Ambulatory Visit: Payer: Self-pay | Admitting: Nurse Practitioner

## 2017-08-29 LAB — CBC WITH DIFFERENTIAL/PLATELET
Basophils Absolute: 0.1 x10E3/uL (ref 0.0–0.2)
Basos: 1 %
EOS (ABSOLUTE): 0.1 x10E3/uL (ref 0.0–0.4)
Eos: 1 %
Hematocrit: 35.5 % (ref 34.0–46.6)
Hemoglobin: 10.8 g/dL — ABNORMAL LOW (ref 11.1–15.9)
Immature Grans (Abs): 0 x10E3/uL (ref 0.0–0.1)
Immature Granulocytes: 0 %
Lymphocytes Absolute: 2.4 x10E3/uL (ref 0.7–3.1)
Lymphs: 30 %
MCH: 22.2 pg — ABNORMAL LOW (ref 26.6–33.0)
MCHC: 30.4 g/dL — ABNORMAL LOW (ref 31.5–35.7)
MCV: 73 fL — ABNORMAL LOW (ref 79–97)
Monocytes Absolute: 0.6 x10E3/uL (ref 0.1–0.9)
Monocytes: 7 %
Neutrophils Absolute: 5 x10E3/uL (ref 1.4–7.0)
Neutrophils: 61 %
Platelets: 263 x10E3/uL (ref 150–450)
RBC: 4.86 x10E6/uL (ref 3.77–5.28)
RDW: 18.1 % — ABNORMAL HIGH (ref 12.3–15.4)
WBC: 8.1 x10E3/uL (ref 3.4–10.8)

## 2017-08-29 LAB — VITAMIN D 25 HYDROXY (VIT D DEFICIENCY, FRACTURES): VIT D 25 HYDROXY: 22.5 ng/mL — AB (ref 30.0–100.0)

## 2017-08-29 LAB — TSH: TSH: 0.076 u[IU]/mL — AB (ref 0.450–4.500)

## 2017-08-29 MED ORDER — LEVOTHYROXINE SODIUM 137 MCG PO TABS: 137.0000 ug | ORAL_TABLET | Freq: Every day | ORAL | 0 refills | Status: DC

## 2017-08-31 ENCOUNTER — Telehealth: Payer: Self-pay | Admitting: Nurse Practitioner

## 2017-08-31 ENCOUNTER — Other Ambulatory Visit: Payer: Self-pay | Admitting: *Deleted

## 2017-08-31 DIAGNOSIS — E039 Hypothyroidism, unspecified: Secondary | ICD-10-CM

## 2017-08-31 MED ORDER — LEVOTHYROXINE SODIUM 137 MCG PO TABS: 137.0000 ug | ORAL_TABLET | Freq: Every day | ORAL | 0 refills | Status: DC

## 2017-08-31 NOTE — Telephone Encounter (Signed)
Med resent to pharm. Pt notified on voicemail.

## 2017-08-31 NOTE — Telephone Encounter (Signed)
Patient called saying she went to [facility] today and they said they don't have her thyroid medicine.  I told her [facility] in [place] receipted it on June 8th.  She asked if we would resend it.

## 2017-09-01 ENCOUNTER — Other Ambulatory Visit: Payer: Self-pay | Admitting: *Deleted

## 2017-09-01 MED ORDER — LEVOTHYROXINE SODIUM 137 MCG PO TABS: 137.0000 ug | ORAL_TABLET | Freq: Every day | ORAL | 0 refills | Status: DC

## 2017-09-10 ENCOUNTER — Encounter: Payer: Self-pay | Admitting: Family Medicine

## 2017-09-14 ENCOUNTER — Ambulatory Visit (HOSPITAL_COMMUNITY): Payer: BLUE CROSS/BLUE SHIELD

## 2017-09-14 ENCOUNTER — Other Ambulatory Visit (HOSPITAL_COMMUNITY): Payer: BLUE CROSS/BLUE SHIELD

## 2017-10-26 DIAGNOSIS — Z85828 Personal history of other malignant neoplasm of skin: Secondary | ICD-10-CM | POA: Diagnosis not present

## 2017-10-26 DIAGNOSIS — L281 Prurigo nodularis: Secondary | ICD-10-CM | POA: Diagnosis not present

## 2017-10-26 DIAGNOSIS — Z08 Encounter for follow-up examination after completed treatment for malignant neoplasm: Secondary | ICD-10-CM | POA: Diagnosis not present

## 2017-10-30 ENCOUNTER — Other Ambulatory Visit: Payer: Self-pay | Admitting: Family Medicine

## 2017-10-31 DIAGNOSIS — E039 Hypothyroidism, unspecified: Secondary | ICD-10-CM | POA: Diagnosis not present

## 2017-11-01 LAB — TSH: TSH: 0.317 u[IU]/mL — ABNORMAL LOW (ref 0.450–4.500)

## 2017-11-11 ENCOUNTER — Other Ambulatory Visit: Payer: Self-pay | Admitting: *Deleted

## 2017-11-11 DIAGNOSIS — E039 Hypothyroidism, unspecified: Secondary | ICD-10-CM

## 2017-11-11 MED ORDER — LEVOTHYROXINE SODIUM 125 MCG PO TABS: 125.0000 ug | ORAL_TABLET | Freq: Every day | ORAL | 1 refills | Status: DC

## 2018-01-06 ENCOUNTER — Ambulatory Visit (INDEPENDENT_AMBULATORY_CARE_PROVIDER_SITE_OTHER): Payer: BLUE CROSS/BLUE SHIELD

## 2018-01-06 ENCOUNTER — Encounter: Payer: Self-pay | Admitting: Orthopedic Surgery

## 2018-01-06 ENCOUNTER — Ambulatory Visit: Payer: BLUE CROSS/BLUE SHIELD | Admitting: Orthopedic Surgery

## 2018-01-06 VITALS — BP 138/73 | HR 71 | Ht 62.0 in | Wt 186.0 lb

## 2018-01-06 DIAGNOSIS — M1710 Unilateral primary osteoarthritis, unspecified knee: Secondary | ICD-10-CM | POA: Diagnosis not present

## 2018-01-06 DIAGNOSIS — M25561 Pain in right knee: Secondary | ICD-10-CM | POA: Diagnosis not present

## 2018-01-06 DIAGNOSIS — M1611 Unilateral primary osteoarthritis, right hip: Secondary | ICD-10-CM | POA: Diagnosis not present

## 2018-01-06 DIAGNOSIS — M1711 Unilateral primary osteoarthritis, right knee: Secondary | ICD-10-CM | POA: Diagnosis not present

## 2018-01-06 MED ORDER — DICLOFENAC SODIUM 75 MG PO TBEC: 75.0000 mg | DELAYED_RELEASE_TABLET | Freq: Two times a day (BID) | ORAL | 2 refills | Status: DC

## 2018-01-06 NOTE — Progress Notes (Signed)
Progress Note   Patient ID: Raven Ferguson, female   DOB: 12-07-1960, 57 y.o.   MRN: [id number]   Chief Complaint  Patient presents with  . Knee Pain    right     HPI The patient presents for evaluation of going pain right knee currently on diclofenac.  The patient's mom has started to take a turn for the worse in her health she has to take care of her her knee pain increased  Location right knee pain  Duration 6 months Quality dull ache medial and lateral joint line Severity breakthrough pain on diclofenac Associated with weightbearing and increase in activity  Review of Systems  Constitutional: Negative.   Neurological: Negative.    Current Meds  Medication Sig  . diclofenac (VOLTAREN) 75 MG EC tablet Take 1 tablet (75 mg total) by mouth 2 (two) times daily with a meal.  . Glucosamine-Chondroitin (COSAMIN DS PO) Take by mouth daily.  Marland Kitchen levothyroxine (SYNTHROID, LEVOTHROID) 125 MCG tablet Take 1 tablet (125 mcg total) by mouth daily before breakfast.  . pantoprazole (PROTONIX) 40 MG tablet TAKE 1 TABLET BY MOUTH EVERY DAY  . PARoxetine (PAXIL) 20 MG tablet TAKE 2 tabs (40 mg)  BY MOUTH DAILY  . Vitamin D, Ergocalciferol, (DRISDOL) 50000 units CAPS capsule Take 1 capsule (50,000 Units total) by mouth every 7 (seven) days.  . [DISCONTINUED] diclofenac (VOLTAREN) 75 MG EC tablet Take 1 tablet (75 mg total) by mouth 2 (two) times daily with a meal.    Past Medical History:  Diagnosis Date  . Hypothyroidism   . Iron deficiency anemia      No Known Allergies   BP 138/73   Pulse 71   Ht 5\' 2"  (1.575 m)   Wt 186 lb (84.4 kg)   BMI 34.02 kg/m    Physical Exam General appearance normal Oriented x3 normal Mood pleasant affect normal Gait she is noticeably limping  Ortho Exam Left knee and left lower extremity Inspection and palpation revealed no abnormalities Range of motion is full No instability was detected on stress testing Muscle tone and strength was normal  without tremor Skin was warm dry and intact Good pulse and temperature were noted in the extremity Sensation revealed no abnormalities to light touch  Right knee tenderness medial lateral joint line no effusion knee flexion 115 degrees no instability muscle tone is normal skin looks good pulses are excellent no peripheral edema sensation is normal   MEDICAL DECISION MAKING   Imaging:  New radiographs were obtained in the office 3 views right knee see dictated report however compared to the last x-ray taken in April she has progressive worsening medial compartment arthrosis with varus deformity moderate patellofemoral disease with osteophytes.  We see subchondral sclerosis of the medial compartment as well   Encounter Diagnoses  Name Primary?  Marland Kitchen Arthritis of knee Yes  . Right knee pain, unspecified chronicity   . Primary osteoarthritis of right hip      PLAN: (RX., injection, surgery,frx,mri/ct, XR 2 body ares) The patient is not ready to have surgery just yet as she continues to take care of her mom  We refilled her diclofenac and injected her right knee   Procedure note right knee injection verbal consent was obtained to inject right knee joint  Timeout was completed to confirm the site of injection  The medications used were 40 mg of Depo-Medrol and 1% lidocaine 3 cc  Anesthesia was provided by ethyl chloride and the skin was prepped  with alcohol.  After cleaning the skin with alcohol a 20-gauge needle was used to inject the right knee joint. There were no complications. A sterile bandage was applied.  Meds ordered this encounter  Medications  . diclofenac (VOLTAREN) 75 MG EC tablet    Sig: Take 1 tablet (75 mg total) by mouth 2 (two) times daily with a meal.    Dispense:  60 tablet    Refill:  2   9:18 AM 01/06/2018

## 2018-01-06 NOTE — Patient Instructions (Signed)

## 2018-02-03 ENCOUNTER — Other Ambulatory Visit: Payer: Self-pay | Admitting: Family Medicine

## 2018-02-19 DIAGNOSIS — E039 Hypothyroidism, unspecified: Secondary | ICD-10-CM | POA: Diagnosis not present

## 2018-02-20 LAB — TSH: TSH: 1.6 u[IU]/mL (ref 0.450–4.500)

## 2018-02-26 ENCOUNTER — Ambulatory Visit: Payer: BLUE CROSS/BLUE SHIELD | Admitting: Family Medicine

## 2018-03-02 ENCOUNTER — Encounter: Payer: Self-pay | Admitting: Family Medicine

## 2018-03-02 ENCOUNTER — Ambulatory Visit (INDEPENDENT_AMBULATORY_CARE_PROVIDER_SITE_OTHER): Payer: BLUE CROSS/BLUE SHIELD | Admitting: Family Medicine

## 2018-03-02 VITALS — BP 128/86 | Ht 62.0 in | Wt 188.2 lb

## 2018-03-02 DIAGNOSIS — F419 Anxiety disorder, unspecified: Secondary | ICD-10-CM | POA: Diagnosis not present

## 2018-03-02 DIAGNOSIS — E039 Hypothyroidism, unspecified: Secondary | ICD-10-CM | POA: Diagnosis not present

## 2018-03-02 DIAGNOSIS — R5383 Other fatigue: Secondary | ICD-10-CM

## 2018-03-02 MED ORDER — PAROXETINE HCL 20 MG PO TABS: ORAL_TABLET | ORAL | 1 refills | Status: DC

## 2018-03-02 MED ORDER — PANTOPRAZOLE SODIUM 40 MG PO TBEC: 40.0000 mg | DELAYED_RELEASE_TABLET | Freq: Every day | ORAL | 3 refills | Status: DC

## 2018-03-02 MED ORDER — LEVOTHYROXINE SODIUM 125 MCG PO TABS: 125.0000 ug | ORAL_TABLET | Freq: Every day | ORAL | 3 refills | Status: DC

## 2018-03-02 NOTE — Progress Notes (Signed)
   Subjective:    Patient ID: Raven Ferguson, female    DOB: 1960/07/28, 57 y.o.   MRN: [id number]  HPI Pt here today for 6 month follow up. Pt states she has been doing OK.  Pt states her synthroid needed to be adjusted and then repeat lab work and come in for follow up.   Stays physically active   Frustrated about weight fgain Thyroid  Compliant with meds,   tsh overll is now better   Patient notes ongoing compliance with antidepressant medication. No obvious side effects. Reports does not miss a dose. Overall continues to help depression substantially. No thoughts of homicide or suicide. Would like to maintain medication. Takes 20 mg only each morning,   Pt states stix mo ago mood was fairly stable, has 18 y o son not getting along with, mo passed away,  dpressed about family ickness      . Current dose now on track  Ran out of meds three or four days Review of Systems No headache, no major weight loss or weight gain, no chest pain no back pain abdominal pain no change in bowel habits complete ROS otherwise negative     Objective:   Physical Exam Alert and oriented, vitals reviewed and stable, NAD ENT-TM's and ext canals WNL bilat via otoscopic exam Soft palate, tonsils and post pharynx WNL via oropharyngeal exam Neck-symmetric, no masses; thyroid nonpalpable and nontender Pulmonary-no tachypnea or accessory muscle use; Clear without wheezes via auscultation Card--no abnrml murmurs, rhythm reg and rate WNL Carotid pulses symmetric, without bruits        Assessment & Plan:  Impression 1 hypothyroidism controlled now good.  Blood work reviewed.  Maintain same level discussed.  2.  Fatigue.  Ongoing.  Exercise encouraged.  3.  Depression/anxiety.  Chronic in nature.  States medicine overall helping some.  Punctuated by recent challenges and sad event with her mother passing away and she acting as primary caretaker.  At this time patient elects to maintain same meds  which I think is reasonable  Follow-up in 6 months exercise encouraged medications refilled  Greater than 50% of this 25 minute face to face visit was spent in counseling and discussion and coordination of care regarding the above diagnosis/diagnosies

## 2018-04-14 DIAGNOSIS — B078 Other viral warts: Secondary | ICD-10-CM | POA: Diagnosis not present

## 2018-05-02 ENCOUNTER — Other Ambulatory Visit: Payer: Self-pay | Admitting: Family Medicine

## 2018-06-22 ENCOUNTER — Telehealth: Payer: Self-pay | Admitting: Family Medicine

## 2018-06-22 MED ORDER — DIAZEPAM 10 MG PO TABS: ORAL_TABLET | ORAL | 0 refills | Status: DC

## 2018-06-22 NOTE — Telephone Encounter (Signed)
Patient is requesting new prescription for diazopan 10 mg last filled in 08/25/16 by Hoyle Sauer and no longer on her current medication list. She was last seen 08/26/17. [facility]

## 2018-06-22 NOTE — Telephone Encounter (Signed)
Also when calling back please dont call number attached to message its her husband's cell and he is at work. Her number is [phone].

## 2018-06-22 NOTE — Telephone Encounter (Signed)
Ok times one 

## 2018-06-22 NOTE — Telephone Encounter (Signed)
Diazepam 10 mg one qhs Last sent in was 08/25/16. Pt states she has two pills left and wants to get a refill. Rarely takes med.

## 2018-06-22 NOTE — Telephone Encounter (Signed)
Prescription called into pharmacy. Patient notified.

## 2018-07-01 DIAGNOSIS — B078 Other viral warts: Secondary | ICD-10-CM | POA: Diagnosis not present

## 2018-08-04 DIAGNOSIS — D485 Neoplasm of uncertain behavior of skin: Secondary | ICD-10-CM | POA: Diagnosis not present

## 2018-08-04 DIAGNOSIS — B078 Other viral warts: Secondary | ICD-10-CM | POA: Diagnosis not present

## 2018-09-02 ENCOUNTER — Encounter: Payer: Self-pay | Admitting: Family Medicine

## 2018-09-02 ENCOUNTER — Other Ambulatory Visit: Payer: Self-pay

## 2018-09-02 ENCOUNTER — Ambulatory Visit (INDEPENDENT_AMBULATORY_CARE_PROVIDER_SITE_OTHER): Payer: BC Managed Care – PPO | Admitting: Family Medicine

## 2018-09-02 DIAGNOSIS — E039 Hypothyroidism, unspecified: Secondary | ICD-10-CM

## 2018-09-02 DIAGNOSIS — F419 Anxiety disorder, unspecified: Secondary | ICD-10-CM

## 2018-09-02 MED ORDER — PANTOPRAZOLE SODIUM 40 MG PO TBEC: 40.0000 mg | DELAYED_RELEASE_TABLET | Freq: Every day | ORAL | 1 refills | Status: DC

## 2018-09-02 MED ORDER — PAROXETINE HCL 20 MG PO TABS: ORAL_TABLET | ORAL | 1 refills | Status: DC

## 2018-09-02 MED ORDER — LEVOTHYROXINE SODIUM 125 MCG PO TABS: 125.0000 ug | ORAL_TABLET | Freq: Every day | ORAL | 1 refills | Status: DC

## 2018-09-02 NOTE — Progress Notes (Signed)
   Subjective:    Patient ID: Raven Ferguson, female    DOB: May 07, 1960, 58 y.o.   MRN: [id number]  HPI    Review of Systems     Objective:   Physical Exam        Assessment & Plan:

## 2018-09-02 NOTE — Progress Notes (Signed)
   Subjective:    Patient ID: Raven Ferguson, female    DOB: 1960-11-12, 58 y.o.   MRN: [id number]  HPIhypothyroidism. Takes levothyroixine 125 mcg daily.  Takes paxil for depression. Pt states med is working well. No concerns or problems today.   Virtual Visit via Telephone Note  I connected with Raven Ferguson on 09/02/18 at 10:00 AM EDT by telephone and verified that I am speaking with the correct person using two identifiers.  Location: Patient: home Provider: office   I discussed the limitations, risks, security and privacy concerns of performing an evaluation and management service by telephone and the availability of in person appointments. I also discussed with the patient that there may be a patient responsible charge related to this service. The patient expressed understanding and agreed to proceed.   History of Present Illness:    Observations/Objective:   Assessment and Plan:   Follow Up Instructions:    I discussed the assessment and treatment plan with the patient. The patient was provided an opportunity to ask questions and all were answered. The patient agreed with the plan and demonstrated an understanding of the instructions.   The patient was advised to call back or seek an in-person evaluation if the symptoms worsen or if the condition fails to improve as anticipated.  I provided 20 minutes of non-face-to-face time during this encounter.   Thyr, compliant  Patient notes ongoing compliance with antidepressant medication. No obvious side effects. Reports does not miss a dose. Overall continues to help depression substantially. No thoughts of homicide or suicide. Would like to maintain medication.  Patient compliant with insomnia medication. Generally takes most nights. No obvious morning drowsiness. Definitely helps patient sleep. Without it patient states would not get a good nights rest.  Compliant with thyroid medication.  Tries not to miss a dose.  No  symptoms of high or low.  Blood work back in the winter within normal limits on repeat compliant with medications  Quite a bit of stress this past year but clinically improved  Numerous coronavirus questions addressed Review of Systems No headache, no major weight loss or weight gain, no chest pain no back pain abdominal pain no change in bowel habits complete ROS otherwise negative     Objective:   Physical Exam  Alert and oriented, vitals reviewed and stable, NAD ENT-TM's and ext canals WNL bilat via otoscopic exam Soft palate, tonsils and post pharynx WNL via oropharyngeal exam Neck-symmetric, no masses; thyroid nonpalpable and nontender Pulmonary-no tachypnea or accessory muscle use; Clear without wheezes via auscultation Card--no abnrml murmurs, rhythm reg and rate WNL Carotid pulses symmetric, without bruits       Assessment & Plan:  Impression 1 depression.  Clinically stable to maintain same meds  2.  Insomnia ongoing no need for meds meds refilled  3.  Hypothyroidism.  Compliant with meds no symptoms of high or low medicines refilled  Follow-up in 6 months blood work then diet exercise discussed.  General concerns discussed

## 2018-11-12 ENCOUNTER — Other Ambulatory Visit: Payer: Self-pay | Admitting: Family Medicine

## 2019-01-20 DIAGNOSIS — L820 Inflamed seborrheic keratosis: Secondary | ICD-10-CM | POA: Diagnosis not present

## 2019-02-07 ENCOUNTER — Other Ambulatory Visit: Payer: Self-pay | Admitting: Family Medicine

## 2019-02-23 DIAGNOSIS — C44622 Squamous cell carcinoma of skin of right upper limb, including shoulder: Secondary | ICD-10-CM | POA: Diagnosis not present

## 2019-02-23 DIAGNOSIS — C44629 Squamous cell carcinoma of skin of left upper limb, including shoulder: Secondary | ICD-10-CM | POA: Diagnosis not present

## 2019-03-01 ENCOUNTER — Other Ambulatory Visit: Payer: Self-pay

## 2019-03-01 ENCOUNTER — Ambulatory Visit (INDEPENDENT_AMBULATORY_CARE_PROVIDER_SITE_OTHER): Payer: BC Managed Care – PPO | Admitting: Family Medicine

## 2019-03-01 DIAGNOSIS — Z131 Encounter for screening for diabetes mellitus: Secondary | ICD-10-CM

## 2019-03-01 DIAGNOSIS — Z13220 Encounter for screening for lipoid disorders: Secondary | ICD-10-CM | POA: Diagnosis not present

## 2019-03-01 DIAGNOSIS — R5383 Other fatigue: Secondary | ICD-10-CM

## 2019-03-01 DIAGNOSIS — E039 Hypothyroidism, unspecified: Secondary | ICD-10-CM | POA: Diagnosis not present

## 2019-03-01 MED ORDER — PAROXETINE HCL 20 MG PO TABS: ORAL_TABLET | ORAL | 1 refills | Status: DC

## 2019-03-01 MED ORDER — DIAZEPAM 10 MG PO TABS: ORAL_TABLET | ORAL | 0 refills | Status: DC

## 2019-03-01 MED ORDER — LEVOTHYROXINE SODIUM 125 MCG PO TABS: ORAL_TABLET | ORAL | 3 refills | Status: DC

## 2019-03-01 NOTE — Progress Notes (Signed)
   Subjective:  Audio only  Patient ID: Raven Ferguson, female    DOB: Mar 13, 1961, 58 y.o.   MRN: [id number]  HPI  Patient calls for a follow up on thyroid. Patient states she is doing well with no problems. Patient would like a refill on Valium  Virtual Visit via Video Note  I connected with Raven Ferguson on 03/01/19 at 10:00 AM EST by a video enabled telemedicine application and verified that I am speaking with the correct person using two identifiers.  Location: Patient: home Provider: office   I discussed the limitations of evaluation and management by telemedicine and the availability of in person appointments. The patient expressed understanding and agreed to proceed.  History of Present Illness:    Observations/Objective:   Assessment and Plan:   Follow Up Instructions:    I discussed the assessment and treatment plan with the patient. The patient was provided an opportunity to ask questions and all were answered. The patient agreed with the plan and demonstrated an understanding of the instructions.   The patient was advised to call back or seek an in-person evaluation if the symptoms worsen or if the condition fails to improve as anticipated.  I provided 18 minutes of non-face-to-face time during this encounter.  Patient compliant with thyroid medication.  Does not miss a dose.  No blood work for 1 year.  No symptoms of low or high thyroid  Uses Valium intermittently.  States it definitely helps would like to have around for high anxiety periods  Compliant with the Paxil.  Taking just 1 tablet a day now.  Overall controls her mood well   Review of Systems No headache, no major weight loss or weight gain, no chest pain no back pain abdominal pain no change in bowel habits complete ROS otherwise negative     Objective:   Physical Exam Virtual       Assessment & Plan:  Impression #1 hypothyroidism status uncertain compliance discussed medications refilled  blood work ordered  2.  Depression/anxiety Paxil refilled.  Exercise encouraged.  Valium refilled for occasional use follow-up in 6 months diet exercise discussed

## 2019-03-03 DIAGNOSIS — E039 Hypothyroidism, unspecified: Secondary | ICD-10-CM | POA: Diagnosis not present

## 2019-03-03 DIAGNOSIS — R5383 Other fatigue: Secondary | ICD-10-CM | POA: Diagnosis not present

## 2019-03-03 DIAGNOSIS — Z131 Encounter for screening for diabetes mellitus: Secondary | ICD-10-CM | POA: Diagnosis not present

## 2019-03-03 DIAGNOSIS — Z13220 Encounter for screening for lipoid disorders: Secondary | ICD-10-CM | POA: Diagnosis not present

## 2019-03-04 LAB — BASIC METABOLIC PANEL
BUN/Creatinine Ratio: 15 (ref 9–23)
BUN: 9 mg/dL (ref 6–24)
CO2: 24 mmol/L (ref 20–29)
Calcium: 8.9 mg/dL (ref 8.7–10.2)
Chloride: 105 mmol/L (ref 96–106)
Creatinine, Ser: 0.62 mg/dL (ref 0.57–1.00)
GFR calc Af Amer: 115 mL/min/{1.73_m2} (ref >59)
GFR calc non Af Amer: 100 mL/min/{1.73_m2} (ref >59)
Glucose: 100 mg/dL — ABNORMAL HIGH (ref 65–99)
Potassium: 4.5 mmol/L (ref 3.5–5.2)
Sodium: 141 mmol/L (ref 134–144)

## 2019-03-04 LAB — LIPID PANEL
Chol/HDL Ratio: 2.9 ratio (ref 0.0–4.4)
Cholesterol, Total: 137 mg/dL (ref 100–199)
HDL: 48 mg/dL (ref >39)
LDL Chol Calc (NIH): 78 mg/dL (ref 0–99)
Triglycerides: 51 mg/dL (ref 0–149)
VLDL Cholesterol Cal: 11 mg/dL (ref 5–40)

## 2019-03-04 LAB — TSH: TSH: 2.78 u[IU]/mL (ref 0.450–4.500)

## 2019-03-04 LAB — HEPATIC FUNCTION PANEL
ALT: 6 IU/L (ref 0–32)
AST: 15 IU/L (ref 0–40)
Albumin: 3.8 g/dL (ref 3.8–4.9)
Alkaline Phosphatase: 104 IU/L (ref 39–117)
Bilirubin Total: 0.4 mg/dL (ref 0.0–1.2)
Bilirubin, Direct: 0.13 mg/dL (ref 0.00–0.40)
Total Protein: 6.8 g/dL (ref 6.0–8.5)

## 2019-03-04 LAB — VITAMIN D 25 HYDROXY (VIT D DEFICIENCY, FRACTURES): Vit D, 25-Hydroxy: 15.3 ng/mL — ABNORMAL LOW (ref 30.0–100.0)

## 2019-03-06 ENCOUNTER — Encounter: Payer: Self-pay | Admitting: Family Medicine

## 2019-03-09 DIAGNOSIS — C44622 Squamous cell carcinoma of skin of right upper limb, including shoulder: Secondary | ICD-10-CM | POA: Diagnosis not present

## 2019-03-23 ENCOUNTER — Other Ambulatory Visit: Payer: Self-pay | Admitting: Orthopedic Surgery

## 2019-03-23 DIAGNOSIS — M25561 Pain in right knee: Secondary | ICD-10-CM

## 2019-03-23 DIAGNOSIS — M1611 Unilateral primary osteoarthritis, right hip: Secondary | ICD-10-CM

## 2019-03-23 MED ORDER — DICLOFENAC SODIUM 75 MG PO TBEC: 75.0000 mg | DELAYED_RELEASE_TABLET | Freq: Two times a day (BID) | ORAL | 2 refills | Status: DC

## 2019-03-23 NOTE — Telephone Encounter (Signed)
Not sure what happened to first request  Patient has not had visit in over a year

## 2019-03-23 NOTE — Telephone Encounter (Signed)
"  Second Request" received in fax from [facility] for medication: diclofenac (VOLTAREN) 75 MG EC tablet 60 tablet

## 2019-04-27 ENCOUNTER — Encounter: Payer: Self-pay | Admitting: Family Medicine

## 2019-06-16 ENCOUNTER — Other Ambulatory Visit: Payer: Self-pay | Admitting: Orthopedic Surgery

## 2019-06-16 DIAGNOSIS — M25561 Pain in right knee: Secondary | ICD-10-CM

## 2019-06-16 DIAGNOSIS — M1611 Unilateral primary osteoarthritis, right hip: Secondary | ICD-10-CM

## 2019-07-14 ENCOUNTER — Other Ambulatory Visit: Payer: Self-pay | Admitting: Family Medicine

## 2019-07-15 NOTE — Telephone Encounter (Signed)
Ok six mo worth 

## 2019-08-14 ENCOUNTER — Other Ambulatory Visit: Payer: Self-pay | Admitting: Family Medicine

## 2020-01-26 ENCOUNTER — Encounter: Payer: Self-pay | Admitting: Family Medicine

## 2020-01-26 ENCOUNTER — Telehealth (INDEPENDENT_AMBULATORY_CARE_PROVIDER_SITE_OTHER): Payer: 59 | Admitting: Family Medicine

## 2020-01-26 ENCOUNTER — Other Ambulatory Visit: Payer: Self-pay

## 2020-01-26 ENCOUNTER — Ambulatory Visit: Payer: 59 | Admitting: Family Medicine

## 2020-01-26 DIAGNOSIS — R059 Cough, unspecified: Secondary | ICD-10-CM

## 2020-01-26 DIAGNOSIS — F17200 Nicotine dependence, unspecified, uncomplicated: Secondary | ICD-10-CM

## 2020-01-26 DIAGNOSIS — R0781 Pleurodynia: Secondary | ICD-10-CM

## 2020-01-26 NOTE — Progress Notes (Signed)
Patient ID: Raven Ferguson, female    DOB: June 04, 1960, 59 y.o.   MRN: [id number]   Chief Complaint  Patient presents with  . Cough    for 2 weeks- patient states se has coughed so much her side is sore   Subjective:    HPI Having coughing for 2 wks, that is persisting. Coughing so much her side is hurting and sore.  Phlegm and clear, thin.  Was thick in beginning. Feeling lots of phlegm in the chest.  Was splinting. Sharp pain in side.  Pt is a smoker 1ppd. And smoked for over 30 yrs.  H/o bronchitis in past. Had to reduce down to 5 cig in past week. In past prescribed albuterol had one that was expired.  Helping some when using inhaler. Had a few mucinex left.   No nasal congestion.  No fever, bodyaches, diarrhea.  Had sore throat and ear pain in beginning and has resolved.   Taking zinc vit C, Vit D3. Has seen Np Hoyle Sauer in past. Pt drinking lots fluids.   Sick contacts- not near anyone with covid. Son and daughter in law- has cough and headache this week, has been around people at work that are "sick"  Daughter in Sports coach -tested this week and was negative for covid. No covid vaccines. Did get flu vaccine 3 wks ago.   Medical History Tieasha has a past medical history of Hypothyroidism and Iron deficiency anemia.   Outpatient Encounter Medications as of 01/26/2020  Medication Sig  . diazepam (VALIUM) 10 MG tablet TAKE 1 TABLET BY MOUTH EVERY NIGHT AT BEDTIME AS NEEDED FOR ANXIETY  . diclofenac (VOLTAREN) 75 MG EC tablet TAKE 1 TABLET(75 MG) BY MOUTH TWICE DAILY WITH A MEAL  . Glucosamine-Chondroitin (COSAMIN DS PO) Take by mouth daily.  Marland Kitchen levothyroxine (SYNTHROID) 125 MCG tablet TAKE 1 TABLET(125 MCG) BY MOUTH DAILY BEFORE BREAKFAST  . pantoprazole (PROTONIX) 40 MG tablet TAKE 1 TABLET BY MOUTH EVERY DAY  . PARoxetine (PAXIL) 20 MG tablet TAKE 1 tab  BY MOUTH DAILY  . Vitamin D, Ergocalciferol, (DRISDOL) 50000 units CAPS capsule Take 1 capsule (50,000 Units total) by mouth  every 7 (seven) days. (Patient not taking: Reported on 03/02/2018)   No facility-administered encounter medications on file as of 01/26/2020.     Review of Systems  Constitutional: Negative for chills and fever.  HENT: Negative for congestion, rhinorrhea and sore throat.   Respiratory: Positive for cough. Negative for chest tightness, shortness of breath and wheezing.        +side pain  Cardiovascular: Negative for chest pain and leg swelling.  Gastrointestinal: Negative for abdominal pain, diarrhea, nausea and vomiting.  Genitourinary: Negative for dysuria and frequency.  Musculoskeletal: Negative for arthralgias and back pain.  Skin: Negative for rash.  Neurological: Negative for dizziness, weakness and headaches.     Vitals There were no vitals taken for this visit.  Objective:   Physical Exam  Phone visit.   Assessment and Plan   1. Coughing  2. Smoker  3. Pain in rib    My recommendation is for pt tol come tomorrow for in person exam.  Due to her having a lot of symptoms of chest and side pain and coughing for over 2 wks.  Pt likely needing covid testing also.   Pt has albuterol at home, advising to use 2 puffs every 4hrs prn. And to continue her mucinex or cough syrup and increase fluids.  Pt continue to avoid smoking.  Pt in agreement with plan.  F/u tomorrow.

## 2020-01-27 ENCOUNTER — Ambulatory Visit (INDEPENDENT_AMBULATORY_CARE_PROVIDER_SITE_OTHER): Payer: 59 | Admitting: Family Medicine

## 2020-01-27 ENCOUNTER — Encounter: Payer: Self-pay | Admitting: Family Medicine

## 2020-01-27 VITALS — HR 89 | Temp 98.5°F | Resp 16

## 2020-01-27 DIAGNOSIS — R051 Acute cough: Secondary | ICD-10-CM | POA: Insufficient documentation

## 2020-01-27 MED ORDER — PREDNISONE 20 MG PO TABS: ORAL_TABLET | ORAL | 0 refills | Status: DC

## 2020-01-27 MED ORDER — ALBUTEROL SULFATE HFA 108 (90 BASE) MCG/ACT IN AERS: 2.0000 | INHALATION_SPRAY | Freq: Four times a day (QID) | RESPIRATORY_TRACT | 0 refills | Status: AC | PRN

## 2020-01-27 NOTE — Progress Notes (Signed)
Patient ID: Raven Ferguson, female    DOB: [birth year], 59 y.o.   MRN: [id number]   Chief Complaint  Patient presents with  . Cough   Subjective:  Cc: cough for 2 weeks.   Has had "bronchitis" for 2 weeks. Smoker. Started out as allergies. History of bronchitis. Pertinent negatives: no fever, no chills, no ear pain.  Reports that she does not feel that bad, just needs some help with getting rid of the lingering cough.  To note she has stopped smoking, and this cough could be related to her lungs trying to heal.   cough for 2 weeks. Coughing up white mucus. Not tried any treatments.    Medical History Margaretmary has a past medical history of Hypothyroidism and Iron deficiency anemia.   Outpatient Encounter Medications as of 01/27/2020  Medication Sig  . diazepam (VALIUM) 10 MG tablet TAKE 1 TABLET BY MOUTH EVERY NIGHT AT BEDTIME AS NEEDED FOR ANXIETY  . diclofenac (VOLTAREN) 75 MG EC tablet TAKE 1 TABLET(75 MG) BY MOUTH TWICE DAILY WITH A MEAL  . levothyroxine (SYNTHROID) 125 MCG tablet TAKE 1 TABLET(125 MCG) BY MOUTH DAILY BEFORE BREAKFAST  . PARoxetine (PAXIL) 20 MG tablet TAKE 1 tab  BY MOUTH DAILY  . albuterol (VENTOLIN HFA) 108 (90 Base) MCG/ACT inhaler Inhale 2 puffs into the lungs every 6 (six) hours as needed for wheezing or shortness of breath.  . pantoprazole (PROTONIX) 40 MG tablet TAKE 1 TABLET BY MOUTH EVERY DAY  . predniSONE (DELTASONE) 20 MG tablet Take 3 tablets for 3 days, then 2 tablets for 3 days, then one tablet for 3 days by mouth  . [DISCONTINUED] Glucosamine-Chondroitin (COSAMIN DS PO) Take by mouth daily.  . [DISCONTINUED] Vitamin D, Ergocalciferol, (DRISDOL) 50000 units CAPS capsule Take 1 capsule (50,000 Units total) by mouth every 7 (seven) days. (Patient not taking: Reported on 03/02/2018)   No facility-administered encounter medications on file as of 01/27/2020.     Review of Systems  Constitutional: Negative for chills and fever.  HENT: Negative for ear  pain, sinus pressure and sinus pain.   Respiratory: Positive for cough. Negative for shortness of breath.   Cardiovascular: Negative for chest pain.  Gastrointestinal: Negative for abdominal pain.  Hematological: Negative for adenopathy.     Vitals Pulse 89   Temp 98.5 F (36.9 C)   Resp 16   SpO2 96%   Objective:   Physical Exam Constitutional:      General: She is not in acute distress.    Appearance: Normal appearance. She is not ill-appearing or toxic-appearing.  HENT:     Right Ear: Tympanic membrane normal.     Left Ear: Tympanic membrane normal.     Nose: Nose normal. No congestion or rhinorrhea.     Mouth/Throat:     Mouth: Mucous membranes are moist.     Pharynx: Oropharynx is clear.  Cardiovascular:     Rate and Rhythm: Normal rate and regular rhythm.     Heart sounds: Normal heart sounds.  Pulmonary:     Effort: Pulmonary effort is normal. No tachypnea, accessory muscle usage or respiratory distress.     Breath sounds: Examination of the right-middle field reveals wheezing. Examination of the left-middle field reveals wheezing. Wheezing present. No rhonchi or rales.     Comments: Few scattered expiratory wheezes.  Skin:    General: Skin is warm and dry.  Neurological:     Mental Status: She is alert and oriented to person, place, and  time.  Psychiatric:        Mood and Affect: Mood normal.        Behavior: Behavior normal.        Thought Content: Thought content normal.        Judgment: Judgment normal.      Assessment and Plan   1. Acute cough - albuterol (VENTOLIN HFA) 108 (90 Base) MCG/ACT inhaler; Inhale 2 puffs into the lungs every 6 (six) hours as needed for wheezing or shortness of breath.  Dispense: 8 g; Refill: 0 - predniSONE (DELTASONE) 20 MG tablet; Take 3 tablets for 3 days, then 2 tablets for 3 days, then one tablet for 3 days by mouth  Dispense: 18 tablet; Refill: 0 - Novel Coronavirus, NAA ([facility]) - SARS-COV-2, NAA 2 DAY TAT     Patient is a smoker, has had "bronchitis "for 2 weeks now.  Is coughing so much that her side hurts.  Had used an expired albuterol inhaler which helped.  "Does not feel that bad ", just wishes to help the cough. Reports prednisone has helped in the past. She is a smoker and has not smoked in several days. Reports she will not start smoking again.   Covid test performed today as a precaution.  We will notify her once results are available.  Agrees with plan of care discussed today. Understands warning signs to seek further care: fever, chills, shortness of breath, any significant changes in health status. Understands to follow-up if symptoms do not improve.  Explained how the body reacts when one stops smoking, this cough could be related to that, and could take several weeks to resolve.  She does report that she does not feel that bad, does not feel sick.  Chalmers Guest, NP 01/29/2020

## 2020-01-29 LAB — SARS-COV-2, NAA 2 DAY TAT

## 2020-01-29 LAB — NOVEL CORONAVIRUS, NAA: SARS-CoV-2, NAA: NOT DETECTED

## 2020-01-29 LAB — SPECIMEN STATUS REPORT

## 2020-02-16 ENCOUNTER — Other Ambulatory Visit: Payer: Self-pay | Admitting: Family Medicine

## 2020-02-16 DIAGNOSIS — R051 Acute cough: Secondary | ICD-10-CM

## 2020-02-20 NOTE — Telephone Encounter (Signed)
Pls call pt, to see how she is doing with the coughing and recent quitting of smoking.   Is she having worsening coughing, side pain? Fever?  Not wanting to just give another inhaler if things are not improving.  If all is improving and just feeling needing the inhaler prn then let us know.  But if using the inhaler multiple times per day needs to be seen.   Thx,   Dr. Lovena Le

## 2020-02-20 NOTE — Telephone Encounter (Signed)
Seen for cough on 11/4 and 11/5

## 2020-02-21 ENCOUNTER — Telehealth: Payer: Self-pay | Admitting: Family Medicine

## 2020-02-21 NOTE — Telephone Encounter (Signed)
Pt needs to be seen, have her come in for follow up. Dr. Lovena Le

## 2020-02-21 NOTE — Telephone Encounter (Signed)
Tried to call voicemail is full

## 2020-02-21 NOTE — Telephone Encounter (Signed)
Pharmacy requesting refill on Paroxetine 20 mg tablet. Take one tablet po daily. Please advise. Thank you

## 2020-02-22 MED ORDER — PAROXETINE HCL 20 MG PO TABS
ORAL_TABLET | ORAL | 0 refills | Status: DC
Start: 2020-02-22 — End: 2020-05-23

## 2020-02-22 NOTE — Telephone Encounter (Signed)
Needs appt before more refills on albuterol, needs recheck. Thx. Dr. Lovena Le

## 2020-02-22 NOTE — Telephone Encounter (Signed)
Tried to call no answer

## 2020-02-22 NOTE — Addendum Note (Signed)
Addended by: Erven Colla on: 02/22/2020 05:28 PM   Modules accepted: Orders

## 2020-02-24 ENCOUNTER — Other Ambulatory Visit: Payer: Self-pay

## 2020-02-24 ENCOUNTER — Ambulatory Visit (INDEPENDENT_AMBULATORY_CARE_PROVIDER_SITE_OTHER): Payer: 59 | Admitting: Family Medicine

## 2020-02-24 ENCOUNTER — Encounter: Payer: Self-pay | Admitting: Family Medicine

## 2020-02-24 VITALS — BP 132/100 | HR 80 | Temp 97.9°F | Wt 214.4 lb

## 2020-02-24 DIAGNOSIS — E559 Vitamin D deficiency, unspecified: Secondary | ICD-10-CM

## 2020-02-24 DIAGNOSIS — F17200 Nicotine dependence, unspecified, uncomplicated: Secondary | ICD-10-CM | POA: Diagnosis not present

## 2020-02-24 DIAGNOSIS — D509 Iron deficiency anemia, unspecified: Secondary | ICD-10-CM

## 2020-02-24 DIAGNOSIS — I10 Essential (primary) hypertension: Secondary | ICD-10-CM

## 2020-02-24 DIAGNOSIS — R0781 Pleurodynia: Secondary | ICD-10-CM

## 2020-02-24 DIAGNOSIS — Z13220 Encounter for screening for lipoid disorders: Secondary | ICD-10-CM

## 2020-02-24 DIAGNOSIS — E039 Hypothyroidism, unspecified: Secondary | ICD-10-CM | POA: Diagnosis not present

## 2020-02-24 DIAGNOSIS — Z130 Encounter for screening for diseases of the blood and blood-forming organs and certain disorders involving the immune mechanism: Secondary | ICD-10-CM

## 2020-02-24 MED ORDER — DIAZEPAM 10 MG PO TABS
ORAL_TABLET | ORAL | 2 refills | Status: DC
Start: 2020-02-24 — End: 2020-11-21

## 2020-02-24 MED ORDER — AMLODIPINE BESYLATE 2.5 MG PO TABS: 2.5000 mg | ORAL_TABLET | Freq: Every day | ORAL | 1 refills | Status: DC

## 2020-02-24 NOTE — Progress Notes (Signed)
Patient ID: Raven Ferguson, female    DOB: 1961/01/31, 59 y.o.   MRN: [id number]   Chief Complaint  Patient presents with  . Hypothyroidism  . Anxiety    Patient requesting refill on all medications.   . Follow-up    Patient was seen for a cough last month and has been having right side pain with deep breaths for the last couple of weeks.    Subjective:    HPI  Pt recently sick with URI.  Pt thought had bronchitis.  Then needed to do tent visit, then NP Santiago Glad.  Given steroids and albuterol from that visit. Pt is open in sinuses and had all chest.  Wondered if turned into pneumonia.  Not sure if cracked a rib on right side from lots of coughing.   Pt is a smoker.  And cut back from 1ppd to 5 cig per day.  Feeling better now.  Mild coughing.  Pt declining chest xray. Not sure if pulled a muscle.  Rt flank/anterior lower ribs.  Pt not needing a new inhaler now. Only using it at night.  Not as active as used to be. In past 2 yrs.   Has low vit d and still taking mvt and vit d daily.  Insomnia- taking valium taking as needed for sleep.  Can take for 1 wk, then not needing it for 3 wks.   Anxiety-Taking paxil daily and doing well.   Taking diclofenac for rt knee pain prn. Not taking daily, given by ortho.  162/102 when came in to office. Had gastric bypass in 2019.  Lost a lot of weight then, over 200 lbs.  Was taking care of mother.  And with covid has gained some weight. Thinking gained 35 lbs in past 1.5 yrs.  Medical History Raven Ferguson has a past medical history of Hypothyroidism and Iron deficiency anemia.   Outpatient Encounter Medications as of 02/24/2020  Medication Sig  . OVER THE COUNTER MEDICATION Vitamin d 3 Multivitamin  . albuterol (VENTOLIN HFA) 108 (90 Base) MCG/ACT inhaler Inhale 2 puffs into the lungs every 6 (six) hours as needed for wheezing or shortness of breath.  Marland Kitchen amLODipine (NORVASC) 2.5 MG tablet Take 1 tablet (2.5 mg total) by mouth daily.  .  diazepam (VALIUM) 10 MG tablet TAKE 1 TABLET BY MOUTH EVERY NIGHT AT BEDTIME AS NEEDED FOR ANXIETY  . diclofenac (VOLTAREN) 75 MG EC tablet TAKE 1 TABLET(75 MG) BY MOUTH TWICE DAILY WITH A MEAL  . levothyroxine (SYNTHROID) 125 MCG tablet TAKE 1 TABLET(125 MCG) BY MOUTH DAILY BEFORE BREAKFAST  . pantoprazole (PROTONIX) 40 MG tablet TAKE 1 TABLET BY MOUTH EVERY DAY  . PARoxetine (PAXIL) 20 MG tablet TAKE 1 tab  BY MOUTH DAILY  . [DISCONTINUED] diazepam (VALIUM) 10 MG tablet TAKE 1 TABLET BY MOUTH EVERY NIGHT AT BEDTIME AS NEEDED FOR ANXIETY  . [DISCONTINUED] predniSONE (DELTASONE) 20 MG tablet Take 3 tablets for 3 days, then 2 tablets for 3 days, then one tablet for 3 days by mouth   No facility-administered encounter medications on file as of 02/24/2020.     Review of Systems  Constitutional: Negative for chills and fever.  HENT: Negative for congestion, rhinorrhea and sore throat.   Respiratory: Negative for cough, shortness of breath and wheezing.   Cardiovascular: Positive for chest pain (rt rib pain). Negative for leg swelling.  Gastrointestinal: Negative for abdominal pain, diarrhea, nausea and vomiting.  Genitourinary: Negative for dysuria and frequency.  Musculoskeletal: Negative for arthralgias  and back pain.  Skin: Negative for rash.  Neurological: Negative for dizziness, weakness and headaches.     Vitals BP (!) 132/100   Pulse 80   Temp 97.9 F (36.6 C)   Wt 214 lb 6.4 oz (97.3 kg)   SpO2 100%   BMI 39.21 kg/m   Objective:   Physical Exam Vitals and nursing note reviewed.  Constitutional:      Appearance: Normal appearance.  HENT:     Head: Normocephalic and atraumatic.     Nose: Nose normal.     Mouth/Throat:     Mouth: Mucous membranes are moist.     Pharynx: Oropharynx is clear.  Eyes:     Extraocular Movements: Extraocular movements intact.     Conjunctiva/sclera: Conjunctivae normal.     Pupils: Pupils are equal, round, and reactive to light.   Cardiovascular:     Rate and Rhythm: Normal rate and regular rhythm.     Pulses: Normal pulses.     Heart sounds: Normal heart sounds.  Pulmonary:     Effort: Pulmonary effort is normal.     Breath sounds: Normal breath sounds. No wheezing, rhonchi or rales.  Chest:    Musculoskeletal:        General: Normal range of motion.     Right lower leg: No edema.     Left lower leg: No edema.  Skin:    General: Skin is warm and dry.     Findings: No lesion or rash.  Neurological:     General: No focal deficit present.     Mental Status: She is alert and oriented to person, place, and time.  Psychiatric:        Mood and Affect: Mood normal.        Behavior: Behavior normal.      Assessment and Plan   1. Hypertension, unspecified type - CMP14+EGFR  2. Smoker  3. Hypothyroidism, unspecified type - TSH  4. Lipid screening - Lipid panel  5. Screening for deficiency anemia - CBC  6. Iron deficiency anemia, unspecified iron deficiency anemia type - Iron Binding Cap (TIBC)([facility]) - Iron - Ferritin  7. Vitamin D deficiency - Vitamin D (25 hydroxy)  8. Rib pain on right side    htn- uncontrolled.  138/100 on recheck by me. Initially was 162/102 by the nurse. Will start norvasc 2.48m daily given.   Insomnia- pt stating taking prn. Discussed long term side effects of taking benzodiazepines.  Pt voiced understanding.  Pt not having side effects. Pt needing refill on valium, only taking prn.  Rt rib pain/recent URI- Pt declining xray today for rt anterior rib pain.  If not better in 1 wk call back about rt rib pain, fever, or sob. Taking aleve prn.  Smoker- discussed smoking cessation.  Pt is trying to cut back.  Hypothyroid- will check labs, cont meds.  Vit d def and anemia- will recheck labs.  F/u [id number] prn.

## 2020-02-25 LAB — CMP14+EGFR
ALT: 16 IU/L (ref 0–32)
AST: 24 IU/L (ref 0–40)
Albumin/Globulin Ratio: 1.1 — ABNORMAL LOW (ref 1.2–2.2)
Albumin: 3.9 g/dL (ref 3.8–4.9)
Alkaline Phosphatase: 118 IU/L (ref 44–121)
BUN/Creatinine Ratio: 10 (ref 9–23)
BUN: 7 mg/dL (ref 6–24)
Bilirubin Total: 0.3 mg/dL (ref 0.0–1.2)
CO2: 26 mmol/L (ref 20–29)
Calcium: 9.1 mg/dL (ref 8.7–10.2)
Chloride: 104 mmol/L (ref 96–106)
Creatinine, Ser: 0.73 mg/dL (ref 0.57–1.00)
GFR calc Af Amer: 104 mL/min/{1.73_m2} (ref >59)
GFR calc non Af Amer: 90 mL/min/{1.73_m2} (ref >59)
Globulin, Total: 3.5 g/dL (ref 1.5–4.5)
Glucose: 85 mg/dL (ref 65–99)
Potassium: 5 mmol/L (ref 3.5–5.2)
Sodium: 142 mmol/L (ref 134–144)
Total Protein: 7.4 g/dL (ref 6.0–8.5)

## 2020-02-25 LAB — FERRITIN: Ferritin: 6 ng/mL — ABNORMAL LOW (ref 15–150)

## 2020-02-25 LAB — IRON AND TIBC
Iron Saturation: 4 % — CL (ref 15–55)
Iron: 21 ug/dL — ABNORMAL LOW (ref 27–159)
Total Iron Binding Capacity: 481 ug/dL — ABNORMAL HIGH (ref 250–450)
UIBC: 460 ug/dL — ABNORMAL HIGH (ref 131–425)

## 2020-02-25 LAB — CBC
Hematocrit: 36.6 % (ref 34.0–46.6)
Hemoglobin: 10.6 g/dL — ABNORMAL LOW (ref 11.1–15.9)
MCH: 21.9 pg — ABNORMAL LOW (ref 26.6–33.0)
MCHC: 29 g/dL — ABNORMAL LOW (ref 31.5–35.7)
MCV: 76 fL — ABNORMAL LOW (ref 79–97)
Platelets: 204 10*3/uL (ref 150–450)
RBC: 4.83 x10E6/uL (ref 3.77–5.28)
RDW: 17.1 % — ABNORMAL HIGH (ref 11.7–15.4)
WBC: 4.1 10*3/uL (ref 3.4–10.8)

## 2020-02-25 LAB — LIPID PANEL
Chol/HDL Ratio: 2.7 ratio (ref 0.0–4.4)
Cholesterol, Total: 146 mg/dL (ref 100–199)
HDL: 54 mg/dL (ref >39)
LDL Chol Calc (NIH): 79 mg/dL (ref 0–99)
Triglycerides: 67 mg/dL (ref 0–149)
VLDL Cholesterol Cal: 13 mg/dL (ref 5–40)

## 2020-02-25 LAB — VITAMIN D 25 HYDROXY (VIT D DEFICIENCY, FRACTURES): Vit D, 25-Hydroxy: 29.2 ng/mL — ABNORMAL LOW (ref 30.0–100.0)

## 2020-02-25 LAB — TSH: TSH: 7.65 u[IU]/mL — ABNORMAL HIGH (ref 0.450–4.500)

## 2020-02-27 NOTE — Telephone Encounter (Signed)
Tried to call no answer

## 2020-02-28 NOTE — Telephone Encounter (Signed)
Patient states she is doing fine- no further problems and still has an inhaler that is almost full. Patient states the pharmacy must have sent by accident because she has not requested nor needs a refill

## 2020-04-13 ENCOUNTER — Encounter: Payer: Self-pay | Admitting: Nurse Practitioner

## 2020-04-13 ENCOUNTER — Ambulatory Visit (INDEPENDENT_AMBULATORY_CARE_PROVIDER_SITE_OTHER): Payer: 59 | Admitting: Nurse Practitioner

## 2020-04-13 ENCOUNTER — Other Ambulatory Visit: Payer: Self-pay

## 2020-04-13 VITALS — BP 134/88 | HR 88 | Temp 98.2°F | Ht 62.0 in | Wt 211.8 lb

## 2020-04-13 DIAGNOSIS — E039 Hypothyroidism, unspecified: Secondary | ICD-10-CM

## 2020-04-13 DIAGNOSIS — K458 Other specified abdominal hernia without obstruction or gangrene: Secondary | ICD-10-CM

## 2020-04-13 DIAGNOSIS — D509 Iron deficiency anemia, unspecified: Secondary | ICD-10-CM

## 2020-04-13 DIAGNOSIS — F17200 Nicotine dependence, unspecified, uncomplicated: Secondary | ICD-10-CM | POA: Diagnosis not present

## 2020-04-13 DIAGNOSIS — L732 Hidradenitis suppurativa: Secondary | ICD-10-CM

## 2020-04-13 DIAGNOSIS — I10 Essential (primary) hypertension: Secondary | ICD-10-CM | POA: Diagnosis not present

## 2020-04-13 MED ORDER — DOXYCYCLINE HYCLATE 100 MG PO TABS: 100.0000 mg | ORAL_TABLET | Freq: Two times a day (BID) | ORAL | 1 refills | Status: DC

## 2020-04-13 NOTE — Patient Instructions (Signed)
Hidradenitis Suppurativa Hidradenitis suppurativa is a long-term (chronic) skin disease. It is similar to a severe form of acne, but it affects areas of the body where acne would be unusual, especially areas of the body where skin rubs against skin and becomes moist. These include:  Underarms.  Groin.  Genital area.  Buttocks.  Upper thighs.  Breasts. Hidradenitis suppurativa may start out as small lumps or pimples caused by blocked sweat glands or hair follicles. Pimples may develop into deep sores that break open (rupture) and drain pus. Over time, affected areas of skin may thicken and become scarred. This condition is rare and does not spread from person to person (non-contagious). What are the causes? The exact cause of this condition is not known. It may be related to:  Female and female hormones.  An overactive disease-fighting system (immune system). The immune system may over-react to blocked hair follicles or sweat glands and cause swelling and pus-filled sores. What increases the risk? You are more likely to develop this condition if you:  Are female.  Are 11-55 years old.  Have a family history of hidradenitis suppurativa.  Have a personal history of acne.  Are overweight.  Smoke.  Take the medicine lithium. What are the signs or symptoms? The first symptoms are usually painful bumps in the skin, similar to pimples. The condition may get worse over time (progress), or it may only cause mild symptoms. If the disease progresses, symptoms may include:  Skin bumps getting bigger and growing deeper into the skin.  Bumps rupturing and draining pus.  Itchy, infected skin.  Skin getting thicker and scarred.  Tunnels under the skin (fistulas) where pus drains from a bump.  Pain during daily activities, such as pain during walking if your groin area is affected.  Emotional problems, such as stress or depression. This condition may affect your appearance and your  ability or willingness to wear certain clothes or do certain activities. How is this diagnosed? This condition is diagnosed by a health care provider who specializes in skin diseases (dermatologist). You may be diagnosed based on:  Your symptoms and medical history.  A physical exam.  Testing a pus sample for infection.  Blood tests. How is this treated? Your treatment will depend on how severe your symptoms are. The same treatment will not work for everybody with this condition. You may need to try several treatments to find what works best for you. Treatment may include:  Cleaning and bandaging (dressing) your wounds as needed.  Lifestyle changes, such as new skin care routines.  Taking medicines, such as: ? Antibiotics. ? Acne medicines. ? Medicines to reduce the activity of the immune system. ? A diabetes medicine (metformin). ? Birth control pills, for women. ? Steroids to reduce swelling and pain.  Working with a mental health care provider, if you experience emotional distress due to this condition. If you have severe symptoms that do not get better with medicine, you may need surgery. Surgery may involve:  Using a laser to clear the skin and remove hair follicles.  Opening and draining deep sores.  Removing the areas of skin that are diseased and scarred. Follow these instructions at home: Medicines  Take over-the-counter and prescription medicines only as told by your health care provider.  If you were prescribed an antibiotic medicine, take it as told by your health care provider. Do not stop taking the antibiotic even if your condition improves.   Skin care  If you have open wounds,   cover them with a clean dressing as told by your health care provider. Keep wounds clean by washing them gently with soap and water when you bathe.  Do not shave the areas where you get hidradenitis suppurativa.  Do not wear deodorant.  Wear loose-fitting clothes.  Try to avoid  getting overheated or sweaty. If you get sweaty or wet, change into clean, dry clothes as soon as you can.  To help relieve pain and itchiness, cover sore areas with a warm, clean washcloth (warm compress) for 5-10 minutes as often as needed.  If told by your health care provider, take a bleach bath twice a week: ? Fill your bathtub halfway with water. ? Pour in  cup of unscented household bleach. ? Soak in the tub for 5-10 minutes. ? Only soak from the neck down. Avoid water on your face and hair. ? Shower to rinse off the bleach from your skin. General instructions  Learn as much as you can about your disease so that you have an active role in your treatment. Work closely with your health care provider to find treatments that work for you.  If you are overweight, work with your health care provider to lose weight as recommended.  Do not use any products that contain nicotine or tobacco, such as cigarettes and e-cigarettes. If you need help quitting, ask your health care provider.  If you struggle with living with this condition, talk with your health care provider or work with a mental health care provider as recommended.  Keep all follow-up visits as told by your health care provider. This is important. Where to find more information  Hidradenitis Suppurativa Foundation, Inc.: https://www.hs-foundation.org/  American Academy of Dermatology: https://www.aad.org Contact a health care provider if you have:  A flare-up of hidradenitis suppurativa.  A fever or chills.  Trouble controlling your symptoms at home.  Trouble doing your daily activities because of your symptoms.  Trouble dealing with emotional problems related to your condition. Summary  Hidradenitis suppurativa is a long-term (chronic) skin disease. It is similar to a severe form of acne, but it affects areas of the body where acne would be unusual.  The first symptoms are usually painful bumps in the skin, similar  to pimples. The condition may only cause mild symptoms, or it may get worse over time (progress).  If you have open wounds, cover them with a clean dressing as told by your health care provider. Keep wounds clean by washing them gently with soap and water when you bathe.  Besides skin care, treatment may include medicines, laser treatment, and surgery. This information is not intended to replace advice given to you by your health care provider. Make sure you discuss any questions you have with your health care provider. Document Revised: 01/03/2020 Document Reviewed: 01/03/2020 Elsevier Patient Education  2021 [employer].  

## 2020-04-13 NOTE — Progress Notes (Signed)
   Subjective:    Patient ID: Raven Ferguson, female    DOB: 1960/09/26, 60 y.o.   MRN: [id number]  HPI Patient comes in to discuss abdominal hernia. Hernia is not causing any problems but has been protruding out slowly since she was sick and coughing a couple months ago.   Patient has also been seen by derm recently for cancer spots and would like to discuss this with Raven Ferguson.    Review of Systems     Objective:   Physical Exam        Assessment & Plan:

## 2020-04-13 NOTE — Progress Notes (Addendum)
Subjective:    Patient ID: Raven Ferguson, female    DOB: January 16, 1961, 60 y.o.   MRN: [id number]  HPI   Patient is in today for concerns with abdominal hernia below the epigastric area, a cyst in her left groin,  and concerns about BP.     The hernia has been present since 2006. She noticed the hernia protruded more with coughing due to a recent bout of bronchitis. Currently a smoker. Smokes less than a pack of cigarettes daily.There are no complaints of pain in the abdominal region. However, the size of the hernia has increased. Her weight has also increased significantly since her last visit. Less active in the winter. Lost both of her parents recently. States she doing fine emotionally. Paxil working well.    Cyst in the left groin appeared couple of months ago. Patient states the exudate from the cyst was clear when squeezed at first . It has hardened slightly over time and can no longer produce any drainage.  She has a history of recurrent lumps under arm but not as frequent since bypass surgery. Left axilla was I&D in the past.   Concerned about elevated BP. States she takes BP as directed but does not check BP at home.    Patient has R. knee pain but manages it with diclofenac PO. Takes this about every 3 days which works great. Has seen Dr. Aline Brochure in the past. Will need joint replacement at some point.  States problems with GERD has resolved and no longer takes protonix.  Takes centrum for chronic anemia. 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   TSH was elevated on the latest lab work but was out of refills at the time for thyroid medicine. Has been taking this daily since.   Patient is not Covid vaccinated, up to date with her flu shot, has ophthalmologist appointment in February as well as dermatology appointment for abnormal skin lesion on her left lower leg.   Review of Systems  Constitutional: Negative for appetite change, fatigue and fever.       Weight today is 211lbs-  has maintained a weight of 185 lbs since her bypass surgery. Physical activity has decreased.   Respiratory: Negative for cough, chest tightness, shortness of breath and wheezing.   Cardiovascular: Negative for chest pain and leg swelling.  Gastrointestinal: Negative for abdominal pain, anal bleeding, blood in stool, constipation, diarrhea, nausea and vomiting.  Skin: Negative for color change.  Allergic/Immunologic: Negative.        Objective:   Physical Exam Constitutional:      Appearance: Normal appearance.  Cardiovascular:     Rate and Rhythm: Normal rate.     Pulses: Normal pulses.     Heart sounds: Normal heart sounds. No murmur heard. No gallop.      Comments: Carotids no bruits or thrills.  Pulmonary:     Effort: Pulmonary effort is normal. No respiratory distress.     Breath sounds: Normal breath sounds. No wheezing.  Abdominal:     General: Bowel sounds are normal.     Palpations: Abdomen is soft.     Hernia: A hernia is present.       Comments: 1) Hernia below the epigastric area. Obvious protrusion noted. Soft and temporarily reducible. Increased with straining/valsalva.    2) The left groin cyst (3 by 4 cm) in the mid intertriginous area. No erythema. Mildly fluctuant but firm. Minimal tenderness to palpation.   Musculoskeletal:  General: No swelling or tenderness.     Right lower leg: No edema.     Left lower leg: No edema.  Skin:    General: Skin is warm and dry.     Comments: Multiple small tiny superficial linear scars noted in axillary bilaterally, more on the left. A few similar lesions noted in the left intertriginous area.   Neurological:     Mental Status: She is oriented to person, place, and time.  Psychiatric:        Mood and Affect: Mood normal.        Behavior: Behavior normal.        Thought Content: Thought content normal.        Judgment: Judgment normal.    Today's Vitals   04/13/20 0925 04/13/20 1030  BP: (!) 140/92 134/88   Pulse: 88   Temp: 98.2 F (36.8 C)   SpO2: 99%   Weight: 211 lb 12.8 oz (96.1 kg)   Height: 5\' 2"  (1.575 m)    Body mass index is 38.74 kg/m.       Assessment & Plan:   Problem List Items Addressed This Visit      Cardiovascular and Mediastinum   Primary hypertension     Endocrine   Hypothyroidism   Relevant Orders   TSH     Musculoskeletal and Integument   Hidradenitis suppurativa     Other   Abdominal hernia - Primary   Iron deficiency anemia   Relevant Orders   CBC with Differential/Platelet   TSH   Smoker      Meds ordered this encounter  Medications  . doxycycline (VIBRA-TABS) 100 MG tablet    Sig: Take 1 tablet (100 mg total) by mouth 2 (two) times daily.    Dispense:  20 tablet    Refill:  1    Order Specific Question:   Supervising Provider    Answer:   Sallee Lange A [[id number]]     1) Recommend low dose CT scan for lung cancer screening. Defers until her wellness exam.  2) Follow up visit in 3 months for physical.  3) Discussed smoking cessation.  4) Labs ordered for CBC and thyroid level 5) Importance of increasing physical activities. Discussed importance of weight loss. Goal is to get back below 200 by the time of her physical.  6) Discussed diagnosis of hidradenitis. Take Doxycycline as directed. Call back in 2 weeks if lesion has not resolved, sooner if worse.  7) Defers surgical referral for hernia at this time. Discussed warning signs of incarcerated hernia. See help immediately if any problems.  Return for Follow up in March for physical and repeat labs.

## 2020-04-14 ENCOUNTER — Encounter: Payer: Self-pay | Admitting: Nurse Practitioner

## 2020-04-14 DIAGNOSIS — Z862 Personal history of diseases of the blood and blood-forming organs and certain disorders involving the immune mechanism: Secondary | ICD-10-CM | POA: Insufficient documentation

## 2020-04-14 DIAGNOSIS — D509 Iron deficiency anemia, unspecified: Secondary | ICD-10-CM | POA: Insufficient documentation

## 2020-05-21 ENCOUNTER — Telehealth: Payer: Self-pay | Admitting: Family Medicine

## 2020-05-21 NOTE — Telephone Encounter (Signed)
Pharmacy requesting refill on Levothyroxine 125 mcg tablet. Take one tablet daily. Pt last seen 04/13/20. Please advise. Thank you

## 2020-05-22 NOTE — Telephone Encounter (Signed)
Lab Results  Component Value Date   TSH 7.650 (H) 02/24/2020   Pt needs to recheck tsh, before more refills.  Pls give 30 day supply.  No refill.  Pt to get tsh soon, and follow up with carolyn as scheduled.    Dr. Lovena Le

## 2020-05-23 ENCOUNTER — Other Ambulatory Visit: Payer: Self-pay | Admitting: Family Medicine

## 2020-05-24 ENCOUNTER — Other Ambulatory Visit: Payer: Self-pay

## 2020-05-24 MED ORDER — LEVOTHYROXINE SODIUM 125 MCG PO TABS: ORAL_TABLET | ORAL | 0 refills | Status: DC

## 2020-05-25 LAB — CBC WITH DIFFERENTIAL/PLATELET
Basophils Absolute: 0.1 10*3/uL (ref 0.0–0.2)
Basos: 1 %
EOS (ABSOLUTE): 0 10*3/uL (ref 0.0–0.4)
Eos: 1 %
Hematocrit: 42.8 % (ref 34.0–46.6)
Hemoglobin: 12.4 g/dL (ref 11.1–15.9)
Immature Grans (Abs): 0 10*3/uL (ref 0.0–0.1)
Immature Granulocytes: 0 %
Lymphocytes Absolute: 2.9 10*3/uL (ref 0.7–3.1)
Lymphs: 36 %
MCH: 22.1 pg — ABNORMAL LOW (ref 26.6–33.0)
MCHC: 29 g/dL — ABNORMAL LOW (ref 31.5–35.7)
MCV: 76 fL — ABNORMAL LOW (ref 79–97)
Monocytes Absolute: 0.6 10*3/uL (ref 0.1–0.9)
Monocytes: 8 %
Neutrophils Absolute: 4.3 10*3/uL (ref 1.4–7.0)
Neutrophils: 54 %
Platelets: 261 10*3/uL (ref 150–450)
RBC: 5.6 x10E6/uL — ABNORMAL HIGH (ref 3.77–5.28)
RDW: 16.3 % — ABNORMAL HIGH (ref 11.7–15.4)
WBC: 8 10*3/uL (ref 3.4–10.8)

## 2020-05-25 LAB — TSH: TSH: 4.05 u[IU]/mL (ref 0.450–4.500)

## 2020-06-08 ENCOUNTER — Ambulatory Visit: Payer: 59 | Admitting: Nurse Practitioner

## 2020-07-01 ENCOUNTER — Other Ambulatory Visit: Payer: Self-pay | Admitting: Family Medicine

## 2020-07-28 ENCOUNTER — Other Ambulatory Visit: Payer: Self-pay | Admitting: Family Medicine

## 2020-08-08 ENCOUNTER — Other Ambulatory Visit: Payer: Self-pay | Admitting: Family Medicine

## 2020-08-08 NOTE — Telephone Encounter (Signed)
Lab Results  Component Value Date   TSH 4.050 05/24/2020

## 2020-08-17 ENCOUNTER — Other Ambulatory Visit: Payer: Self-pay | Admitting: Family Medicine

## 2020-08-23 NOTE — Telephone Encounter (Signed)
Schedule appointment for 6/9 for medication refills

## 2020-08-30 ENCOUNTER — Other Ambulatory Visit: Payer: Self-pay

## 2020-08-30 ENCOUNTER — Ambulatory Visit (INDEPENDENT_AMBULATORY_CARE_PROVIDER_SITE_OTHER): Payer: 59 | Admitting: Nurse Practitioner

## 2020-08-30 ENCOUNTER — Encounter: Payer: Self-pay | Admitting: Nurse Practitioner

## 2020-08-30 VITALS — BP 132/84 | HR 90 | Temp 97.2°F | Ht 62.0 in | Wt 217.0 lb

## 2020-08-30 DIAGNOSIS — I10 Essential (primary) hypertension: Secondary | ICD-10-CM

## 2020-08-30 DIAGNOSIS — F419 Anxiety disorder, unspecified: Secondary | ICD-10-CM

## 2020-08-30 DIAGNOSIS — F17200 Nicotine dependence, unspecified, uncomplicated: Secondary | ICD-10-CM

## 2020-08-30 DIAGNOSIS — K219 Gastro-esophageal reflux disease without esophagitis: Secondary | ICD-10-CM | POA: Diagnosis not present

## 2020-08-30 DIAGNOSIS — E039 Hypothyroidism, unspecified: Secondary | ICD-10-CM

## 2020-08-30 NOTE — Progress Notes (Addendum)
   Subjective:    Patient ID: Raven Ferguson, female    DOB: 1960/06/14, 60 y.o.   MRN: [id number]  Hypertension Patient  not currently taking amlodipine due to it makes her feel weak, and jittery.  Blood pressures have been running 130s over 80s.  Patient declines any further medication at this time.  Denies chest pain/ischemic type pain shortness of breath or edema.  Has cut back to less than 1 pack/day with her smoking, trying to cut down more.  Takes diazepam and albuterol inhaler on a rare basis.  Occasional use of Protonix, does not use this every day.  Reflux symptoms have been well controlled.  Has been struggling some with her weight.  Limited number of meals but does not eat a healthy diet and since the death of her mother has had limited activity.  Does not want to increase the Paxil at this point, has tried that in the past with some side effects. Depression screen [facility] 2/9 08/30/2020 02/24/2020 08/25/2016  Decreased Interest 0 2 0  Down, Depressed, Hopeless 0 0 0  PHQ - 2 Score 0 2 0  Altered sleeping - 0 -  Tired, decreased energy - 1 -  Change in appetite - 0 -  Feeling bad or failure about yourself  - 0 -  Trouble concentrating - 0 -  Moving slowly or fidgety/restless - 0 -  Suicidal thoughts - 0 -  PHQ-9 Score - 3 -  Difficult doing work/chores - Not difficult at all -   Denies suicidal thoughts or ideation.       Objective:   Physical Exam NAD.  Alert, oriented.  Cheerful calm affect.  Making good eye contact.  Dressed appropriately.  Thoughts logical coherent and relevant.  Speech clear.  Thyroid nontender to palpation, no mass or goiter noted.  Lungs clear.  Heart regular rate rhythm.  Abdomen soft nondistended nontender. Today's Vitals   08/30/20 1337  BP: 132/84  Pulse: 90  Temp: (!) 97.2 F (36.2 C)  SpO2: 97%  Weight: 217 lb (98.4 kg)  Height: 5\' 2"  (1.575 m)   Body mass index is 39.69 kg/m.  See labs from 02/24/2020 and 05/24/2020.      Assessment & Plan:    Problem List Items Addressed This Visit       Cardiovascular and Mediastinum   Primary hypertension - Primary     Digestive   Esophageal reflux     Endocrine   Hypothyroidism     Other   Anxiety   Smoker   Patient is overdue for her wellness exam.  Strongly encouraged her to schedule this in the near future. Encouraged regular activity and healthier diet.  Her weight has been stable over the past few months but set a goal with the patient for getting her back below 200 pounds before the next visit. Continue current medication regimen as directed. Return in about 6 months (around 03/01/2021). Hold on low-dose CT scan of the lungs, patient defers until she comes in for her wellness exam.

## 2020-08-31 ENCOUNTER — Telehealth: Payer: Self-pay

## 2020-08-31 ENCOUNTER — Encounter: Payer: Self-pay | Admitting: Nurse Practitioner

## 2020-11-09 ENCOUNTER — Ambulatory Visit (INDEPENDENT_AMBULATORY_CARE_PROVIDER_SITE_OTHER): Payer: 59 | Admitting: Nurse Practitioner

## 2020-11-09 ENCOUNTER — Encounter: Payer: Self-pay | Admitting: Nurse Practitioner

## 2020-11-09 ENCOUNTER — Other Ambulatory Visit: Payer: Self-pay

## 2020-11-09 ENCOUNTER — Telehealth: Payer: Self-pay | Admitting: Nurse Practitioner

## 2020-11-09 VITALS — BP 135/91 | HR 80 | Temp 96.8°F | Ht 62.0 in | Wt 217.0 lb

## 2020-11-09 DIAGNOSIS — Z01411 Encounter for gynecological examination (general) (routine) with abnormal findings: Secondary | ICD-10-CM

## 2020-11-09 DIAGNOSIS — F419 Anxiety disorder, unspecified: Secondary | ICD-10-CM

## 2020-11-09 DIAGNOSIS — I499 Cardiac arrhythmia, unspecified: Secondary | ICD-10-CM

## 2020-11-09 DIAGNOSIS — N760 Acute vaginitis: Secondary | ICD-10-CM

## 2020-11-09 DIAGNOSIS — Z01419 Encounter for gynecological examination (general) (routine) without abnormal findings: Secondary | ICD-10-CM

## 2020-11-09 DIAGNOSIS — F17200 Nicotine dependence, unspecified, uncomplicated: Secondary | ICD-10-CM

## 2020-11-09 DIAGNOSIS — B9689 Other specified bacterial agents as the cause of diseases classified elsewhere: Secondary | ICD-10-CM

## 2020-11-09 DIAGNOSIS — I4891 Unspecified atrial fibrillation: Secondary | ICD-10-CM | POA: Diagnosis not present

## 2020-11-09 DIAGNOSIS — Z124 Encounter for screening for malignant neoplasm of cervix: Secondary | ICD-10-CM

## 2020-11-09 DIAGNOSIS — Z1151 Encounter for screening for human papillomavirus (HPV): Secondary | ICD-10-CM | POA: Diagnosis not present

## 2020-11-09 DIAGNOSIS — Z1231 Encounter for screening mammogram for malignant neoplasm of breast: Secondary | ICD-10-CM | POA: Diagnosis not present

## 2020-11-09 DIAGNOSIS — Z122 Encounter for screening for malignant neoplasm of respiratory organs: Secondary | ICD-10-CM

## 2020-11-09 DIAGNOSIS — E039 Hypothyroidism, unspecified: Secondary | ICD-10-CM

## 2020-11-09 DIAGNOSIS — D509 Iron deficiency anemia, unspecified: Secondary | ICD-10-CM

## 2020-11-09 HISTORY — DX: Cardiac arrhythmia, unspecified: I49.9

## 2020-11-09 MED ORDER — PANTOPRAZOLE SODIUM 40 MG PO TBEC: 40.0000 mg | DELAYED_RELEASE_TABLET | Freq: Every day | ORAL | 1 refills | Status: DC

## 2020-11-09 NOTE — Progress Notes (Signed)
   Subjective:    Patient ID: Raven Ferguson, female    DOB: 1960-11-23, 60 y.o.   MRN: [id number]  HPI  Annual Exam     Review of Systems     Objective:   Physical Exam        Assessment & Plan:

## 2020-11-09 NOTE — Progress Notes (Deleted)
Subjective:    Patient ID: Raven Ferguson, female    DOB: 1961-02-17, 60 y.o.   MRN: [id number]  HPI Patient presents for pap smear and physical. Denies any accidents or falls within the last 6 months. Feels health is fair but conscious of diet "chooses healthy foods". Smokes pack or less per day and has been smoking since 60yr old. Does not drink alcohol. Last colonoscopy was 10 yrs ago. Has no complaints other than a hernia which causes her pain from time to time. She is worried about having to have surgery to correct it and would rather not if at all possible. Experiences SOB on occasion but that's "only due to smoking". No unusual SOB. Does not exercise and doesn't care to start.    Review of Systems  Constitutional:  Negative for fever and unexpected weight change.  HENT:  Negative for sore throat and trouble swallowing.   Respiratory:  Negative for apnea, cough, chest tightness, shortness of breath and wheezing.   Cardiovascular:  Negative for chest pain.  Gastrointestinal:  Positive for abdominal distention and abdominal pain. Negative for blood in stool, constipation, diarrhea, nausea and vomiting.  Genitourinary:  Positive for vaginal pain. Negative for difficulty urinating, dysuria, enuresis, frequency, genital sores, hematuria, pelvic pain, urgency and vaginal bleeding.  Neurological:  Negative for tremors, weakness, numbness and headaches.  Depression screen PHQ 2/9 08/30/2020  Decreased Interest 0  Down, Depressed, Hopeless 0  PHQ - 2 Score 0  Altered sleeping -  Tired, decreased energy -  Change in appetite -  Feeling bad or failure about yourself  -  Trouble concentrating -  Moving slowly or fidgety/restless -  Suicidal thoughts -  PHQ-9 Score -  Difficult doing work/chores -       Objective:   Physical Exam Vitals and nursing note reviewed. Exam conducted with a chaperone present.  Constitutional:      General: She is not in acute distress.    Appearance: She is  obese.  Neck:     Comments: Thyroid non tender, no mass or goiter noted.  Cardiovascular:     Rate and Rhythm: Normal rate. Rhythm irregular.     Heart sounds: Normal heart sounds. No murmur heard.   No gallop.  Pulmonary:     Effort: Pulmonary effort is normal.     Breath sounds: Normal breath sounds.  Chest:  Breasts:    Right: Normal. No swelling, bleeding, inverted nipple, mass, skin change or tenderness.     Left: Normal. No swelling, bleeding, inverted nipple, mass, skin change or tenderness.  Abdominal:     General: There is no distension.     Palpations: Abdomen is soft.     Tenderness: There is abdominal tenderness.     Hernia: A hernia is present.  Genitourinary:    General: Normal vulva.     Comments: External genitalia: no rashes, or lesions, mild erythema at introitus, Discharge present slightly yellowish mucoid moderate amount. No cervical motion tenderness, pink with no lesions. No obvious tenderness or masses noted, Exam limited due to abd girth.  Musculoskeletal:     Cervical back: Normal range of motion.  Lymphadenopathy:     Cervical: No cervical adenopathy.     Upper Body:     Right upper body: No supraclavicular, axillary or pectoral adenopathy.     Left upper body: No supraclavicular, axillary or pectoral adenopathy.  Skin:    General: Skin is warm and dry.  Neurological:  Mental Status: She is alert.  Psychiatric:        Mood and Affect: Mood normal.        Behavior: Behavior normal.        Thought Content: Thought content normal.        Judgment: Judgment normal.   .. Today's Vitals   11/09/20 0826  BP: (!) 135/91  Pulse: 80  Temp: (!) 96.8 F (36 C)  SpO2: 99%  Weight: 217 lb (98.4 kg)  Height: '5\' 2"'$  (1.575 m)   Body mass index is 39.69 kg/m.        Assessment & Plan:   Problem List Items Addressed This Visit       Other   Iron deficiency anemia   Relevant Orders   CBC with Differential/Platelet   Comprehensive metabolic  panel   TSH   Iron and TIBC   Ferritin   Other Visit Diagnoses     Well woman exam    -  Primary   Relevant Orders   CBC with Differential/Platelet   Comprehensive metabolic panel   TSH   Iron and TIBC   Ferritin   IGP, Aptima HPV   Irregular heart beat       Relevant Orders   EKG 12-Lead (Completed)   CBC with Differential/Platelet   Comprehensive metabolic panel   TSH   Iron and TIBC   Ferritin   ECHOCARDIOGRAM COMPLETE   Encounter for screening mammogram for malignant neoplasm of breast       Relevant Orders   MM DIGITAL SCREENING BILATERAL   Atrial fibrillation, unspecified type ([place])       Relevant Orders   CBC with Differential/Platelet   Comprehensive metabolic panel   TSH   Iron and TIBC   Ferritin   Ambulatory referral to Cardiology   Screening for cervical cancer       Relevant Orders   IGP, Aptima HPV   Screening for human papillomavirus (HPV)       Relevant Orders   IGP, Aptima HPV   Screening for lung cancer       Relevant Orders   Ambulatory Referral Lung Cancer Screening [facility]      Meds ordered this encounter  Medications   pantoprazole (PROTONIX) 40 MG tablet    Sig: Take 1 tablet (40 mg total) by mouth daily.    Dispense:  90 tablet    Refill:  1     Plan:  Increase thyroid med CT scan referral Cardiac echo  Cardiac referral Protonix '40mg'$  once daily

## 2020-11-09 NOTE — Patient Instructions (Addendum)
Take a low dose 81 mg coated aspirin daily We will be calling you about the cardiac echo and referral to cardiology Go to ER immediately if you have symptoms   Bacterial Vaginosis  Bacterial vaginosis is an infection of the vagina. It happens when too many normal germs (healthy bacteria) grow in the vagina. This infection can make it easier to get other infectionsfrom sex (STIs). It is very important for pregnant women to get treated. This infection cancause babies to be born early or at a low birth weight. What are the causes? This infection is caused by an increase in certain germs that grow in [place] You cannot get this infection from toilet seats, bedsheets, swimming pools, orthings that touch your vagina. What increases the risk? Having sex with a new person or more than one person. Having sex without protection. Douching. Having an intrauterine device (IUD). Smoking. Using drugs or drinking alcohol. These can lead you to do things that are risky. Taking certain antibiotic medicines. Being pregnant. What are the signs or symptoms? Some women have no symptoms. Symptoms may include: A discharge from your vagina. It may be gray or white. It can be watery or foamy. A fishy smell. This can happen after sex or during your menstrual period. Itching in and around your vagina. A feeling of burning or pain when you pee (urinate). How is this treated? This infection is treated with antibiotic medicines. These may be given to you as: A pill. A cream for your vagina. A medicine that you put into your vagina (suppository). If the infection comes back after treatment, you may need more antibiotics. Follow these instructions at home: Medicines Take over-the-counter and prescription medicines as told by your doctor. Take or use your antibiotic medicine as told by your doctor. Do not stop taking or using it, even if you start to feel better. General instructions If the person you have  sex with is a woman, tell her that you have this infection. She will need to follow up with her doctor. If you have a female partner, he does not need to be treated. Do not have sex until you finish treatment. Drink enough fluid to keep your pee pale yellow. Keep your vagina and butt clean. Wash the area with warm water each day. Wipe from front to back after you use the toilet. If you are breastfeeding a baby, ask your doctor if you should keep doing so during treatment. Keep all follow-up visits. How is this prevented? Self-care Do not douche. Use only warm water to wash around your vagina. Wear underwear that is cotton or lined with cotton. Do not wear tight pants and pantyhose, especially in the summer. Safe sex Use protection when you have sex. This includes: Use condoms. Use dental dams. This is a thin layer that protects the mouth during oral sex. Limit how many people you have sex with. To prevent this infection, it is best to have sex with just one person. Get tested for STIs. The person you have sex with should also get tested. Drugs and alcohol Do not smoke or use any products that contain nicotine or tobacco. If you need help quitting, ask your doctor. Do not use drugs. Do not drink alcohol if: Your doctor tells you not to drink. You are pregnant, may be pregnant, or are planning to become pregnant. If you drink alcohol: Limit how much you have to 0-1 drink a day. Know how much alcohol is in your drink. In the [place]  one drink equals one 12 oz bottle of beer (355 mL), one 5 oz glass of wine (148 mL), or one 1 oz glass of hard liquor (44 mL). Where to find more information Centers for Disease Control and Prevention: http://www.wolf.info/ American Sexual Health Association: www.ashastd.org Office on [employer] Health: [place] Contact a doctor if: Your symptoms do not get better, even after you are treated. You have more discharge or pain when you pee. You have a fever or  chills. You have pain in your belly (abdomen) or in the area between your hips. You have pain with sex. You bleed from your vagina between menstrual periods. Summary This infection can happen when too many germs (bacteria) grow in the vagina. This infection can make it easier to get infections from sex (STIs). Treating this can lower that chance. Get treated if you are pregnant. This infection can cause babies to be born early. Do not stop taking or using your antibiotic medicine, even if you start to feel better. This information is not intended to replace advice given to you by your health care provider. Make sure you discuss any questions you have with your healthcare provider. Document Revised: 09/08/2019 Document Reviewed: 09/08/2019 Elsevier Patient Education  [employer].

## 2020-11-09 NOTE — Telephone Encounter (Signed)
Patient was seen this morning and stated she was supposed to get antibiotic and she is wanting diazepam 10 mg called in to [facility]

## 2020-11-10 ENCOUNTER — Other Ambulatory Visit: Payer: Self-pay | Admitting: Family Medicine

## 2020-11-10 LAB — IRON AND TIBC
Iron Saturation: 5 % — CL (ref 15–55)
Iron: 24 ug/dL — ABNORMAL LOW (ref 27–159)
Total Iron Binding Capacity: 488 ug/dL — ABNORMAL HIGH (ref 250–450)
UIBC: 464 ug/dL — ABNORMAL HIGH (ref 131–425)

## 2020-11-10 LAB — COMPREHENSIVE METABOLIC PANEL
ALT: 9 IU/L (ref 0–32)
AST: 16 IU/L (ref 0–40)
Albumin/Globulin Ratio: 1.3 (ref 1.2–2.2)
Albumin: 4.3 g/dL (ref 3.8–4.9)
Alkaline Phosphatase: 116 IU/L (ref 44–121)
BUN/Creatinine Ratio: 14 (ref 12–28)
BUN: 10 mg/dL (ref 8–27)
Bilirubin Total: 0.4 mg/dL (ref 0.0–1.2)
CO2: 24 mmol/L (ref 20–29)
Calcium: 9.4 mg/dL (ref 8.7–10.3)
Chloride: 101 mmol/L (ref 96–106)
Creatinine, Ser: 0.74 mg/dL (ref 0.57–1.00)
Globulin, Total: 3.2 g/dL (ref 1.5–4.5)
Glucose: 98 mg/dL (ref 65–99)
Potassium: 4.4 mmol/L (ref 3.5–5.2)
Sodium: 141 mmol/L (ref 134–144)
Total Protein: 7.5 g/dL (ref 6.0–8.5)
eGFR: 93 mL/min/{1.73_m2} (ref >59)

## 2020-11-10 LAB — CBC WITH DIFFERENTIAL/PLATELET
Basophils Absolute: 0.1 10*3/uL (ref 0.0–0.2)
Basos: 1 %
EOS (ABSOLUTE): 0 10*3/uL (ref 0.0–0.4)
Eos: 0 %
Hematocrit: 39.2 % (ref 34.0–46.6)
Hemoglobin: 11.8 g/dL (ref 11.1–15.9)
Immature Grans (Abs): 0 10*3/uL (ref 0.0–0.1)
Immature Granulocytes: 0 %
Lymphocytes Absolute: 2.6 10*3/uL (ref 0.7–3.1)
Lymphs: 33 %
MCH: 23.6 pg — ABNORMAL LOW (ref 26.6–33.0)
MCHC: 30.1 g/dL — ABNORMAL LOW (ref 31.5–35.7)
MCV: 79 fL (ref 79–97)
Monocytes Absolute: 0.7 10*3/uL (ref 0.1–0.9)
Monocytes: 9 %
Neutrophils Absolute: 4.3 10*3/uL (ref 1.4–7.0)
Neutrophils: 57 %
Platelets: 243 10*3/uL (ref 150–450)
RBC: 4.99 x10E6/uL (ref 3.77–5.28)
RDW: 15.7 % — ABNORMAL HIGH (ref 11.7–15.4)
WBC: 7.7 10*3/uL (ref 3.4–10.8)

## 2020-11-10 LAB — TSH: TSH: 6.13 u[IU]/mL — ABNORMAL HIGH (ref 0.450–4.500)

## 2020-11-10 LAB — FERRITIN: Ferritin: 7 ng/mL — ABNORMAL LOW (ref 15–150)

## 2020-11-10 MED ORDER — METRONIDAZOLE 500 MG PO TABS: 500.0000 mg | ORAL_TABLET | Freq: Two times a day (BID) | ORAL | 0 refills | Status: DC

## 2020-11-11 ENCOUNTER — Encounter: Payer: Self-pay | Admitting: Nurse Practitioner

## 2020-11-11 MED ORDER — PAROXETINE HCL 20 MG PO TABS: 20.0000 mg | ORAL_TABLET | Freq: Every day | ORAL | 1 refills | Status: DC

## 2020-11-11 NOTE — Progress Notes (Signed)
Subjective:    Patient ID: Raven Ferguson, female    DOB: 05-Oct-1960, 60 y.o.   MRN: [id number]  HPI Patient presents for pap smear and physical. Denies any accidents or falls within the last 6 months. Feels health is fair but conscious of diet "chooses healthy foods". Smokes one pack per day most of the time and has been smoking since 60yr old. Does not drink alcohol. Last colonoscopy was 10 yrs ago. Pantoprazole controls any reflux symptoms. Has no complaints other than a hernia which causes her pain from time to time. She is worried about having to have surgery to correct it, but is willing to consider laparoscopic if possible.  Experiences mild SOB on occasion but that's "only due to smoking". No unusual SOB. Does not exercise and "doesn't care to start". Not currently sexually active. No vaginal bleeding. Defers need for STD testing.     Review of Systems  Constitutional:  Negative for fever and unexpected weight change.  HENT:  Negative for sore throat and trouble swallowing.   Respiratory:  Negative for apnea, cough, chest tightness, shortness of breath and wheezing.   Cardiovascular:  Negative for chest pain.  Gastrointestinal:  Positive for abdominal distention and abdominal pain. Negative for blood in stool, constipation, diarrhea, nausea and vomiting.  Genitourinary:  Positive for vaginal pain. Negative for difficulty urinating, dysuria, enuresis, frequency, genital sores, hematuria, pelvic pain, urgency and vaginal bleeding.  Neurological:  Negative for tremors, weakness, numbness and headaches.  Depression screen PHQ 2/9 08/30/2020  Decreased Interest 0  Down, Depressed, Hopeless 0  PHQ - 2 Score 0  Altered sleeping -  Tired, decreased energy -  Change in appetite -  Feeling bad or failure about yourself  -  Trouble concentrating -  Moving slowly or fidgety/restless -  Suicidal thoughts -  PHQ-9 Score -  Difficult doing work/chores -       Objective:   Physical  Exam Vitals and nursing note reviewed. Exam conducted with a chaperone present.  Constitutional:      General: She is not in acute distress.    Appearance: She is obese.  Neck:     Comments: Thyroid non tender, no mass or goiter noted.  Cardiovascular:     Rate and Rhythm: Normal rate. Rhythm irregular.     Heart sounds: Normal heart sounds. No murmur heard.   No gallop.  Pulmonary:     Effort: Pulmonary effort is normal.     Breath sounds: Normal breath sounds.  Chest:  Breasts:    Right: Normal. No swelling, bleeding, inverted nipple, mass, skin change or tenderness.     Left: Normal. No swelling, bleeding, inverted nipple, mass, skin change or tenderness.  Abdominal:     General: There is no distension.     Palpations: Abdomen is soft.     Tenderness: There is abdominal tenderness.     Hernia: A hernia is present.  Genitourinary:    General: Normal vulva.     Comments: External genitalia: no rashes, or lesions, mild erythema at introitus, Discharge present slightly yellowish mucoid moderate amount. No cervical motion tenderness, pink with no lesions. No obvious tenderness or masses noted, Exam limited due to abd girth.  Musculoskeletal:     Cervical back: Normal range of motion.  Lymphadenopathy:     Cervical: No cervical adenopathy.     Upper Body:     Right upper body: No supraclavicular, axillary or pectoral adenopathy.     Left upper body:  No supraclavicular, axillary or pectoral adenopathy.  Skin:    General: Skin is warm and dry.  Neurological:     Mental Status: She is alert.  Psychiatric:        Mood and Affect: Mood normal.        Behavior: Behavior normal.        Thought Content: Thought content normal.        Judgment: Judgment normal.   .. Today's Vitals   11/09/20 0826  BP: (!) 135/91  Pulse: 80  Temp: (!) 96.8 F (36 C)  SpO2: 99%  Weight: 217 lb (98.4 kg)  Height: '5\' 2"'$  (1.575 m)   Body mass index is 39.69 kg/m.  EKG: A fibrillation with  rate of 75 Wet prep: ph 5.5; occas clue cells; WBC TNTC. Mod bacteria.       Assessment & Plan:   Problem List Items Addressed This Visit       Endocrine   Hypothyroidism     Other   Anxiety   Iron deficiency anemia   Relevant Orders   CBC with Differential/Platelet (Completed)   Comprehensive metabolic panel (Completed)   TSH (Completed)   Iron and TIBC (Completed)   Ferritin (Completed)   Smoker   Other Visit Diagnoses     Well woman exam    -  Primary   Relevant Orders   CBC with Differential/Platelet (Completed)   Comprehensive metabolic panel (Completed)   TSH (Completed)   Iron and TIBC (Completed)   Ferritin (Completed)   IGP, Aptima HPV   Irregular heart beat       Relevant Orders   EKG 12-Lead (Completed)   CBC with Differential/Platelet (Completed)   Comprehensive metabolic panel (Completed)   TSH (Completed)   Iron and TIBC (Completed)   Ferritin (Completed)   ECHOCARDIOGRAM COMPLETE   Encounter for screening mammogram for malignant neoplasm of breast       Relevant Orders   MM DIGITAL SCREENING BILATERAL   Atrial fibrillation, unspecified type ([place])       Relevant Orders   CBC with Differential/Platelet (Completed)   Comprehensive metabolic panel (Completed)   TSH (Completed)   Iron and TIBC (Completed)   Ferritin (Completed)   Ambulatory referral to Cardiology   Screening for cervical cancer       Relevant Orders   IGP, Aptima HPV   Screening for human papillomavirus (HPV)       Relevant Orders   IGP, Aptima HPV   Screening for lung cancer       Relevant Orders   Ambulatory Referral Lung Cancer Screening [facility]   Bacterial vaginosis       Relevant Medications   metroNIDAZOLE (FLAGYL) 500 MG tablet   New onset atrial fibrillation (HCC)          Meds ordered this encounter  Medications   pantoprazole (PROTONIX) 40 MG tablet    Sig: Take 1 tablet (40 mg total) by mouth daily.    Dispense:  90 tablet    Refill:  1    metroNIDAZOLE (FLAGYL) 500 MG tablet    Sig: Take 1 tablet (500 mg total) by mouth 2 (two) times daily with a meal.    Dispense:  14 tablet    Refill:  0    Order Specific Question:   Supervising Provider    Answer:   Sallee Lange A [[id number]]   PARoxetine (PAXIL) 20 MG tablet    Sig: Take 1 tablet (20 mg total) by  mouth daily.    Dispense:  90 tablet    Refill:  1    Order Specific Question:   Supervising Provider    Answer:   Sallee Lange A [[id number]]      Plan:  Labs pending.  Consulted with Dr. Nicki Reaper . Hold on Eliquis at this time. Will obtain cardiac echo and refer to cardiology. Discussed warning signs associated with A fib. Seek help immediately if any new symptoms. [place] VASC score low since patient is no longer on medicine for HTN and BP oustide of the office are 130s/80s. Start daily low dose EC ASA.  Discussed reducing cigarette smoking to less than 1/2 ppd. If iron deficiency sill present on labs, will encourage patient to consider referral to hematology to discuss possible iron infusion.  Referred for low dose CT scan for lung cancer screening.  Mammogram scheduled.  Vaccines: discussed COVID, Tdap, Shingrix and Prevnar 20. Patient defers for now.  Return in about 6 months (around 05/12/2021). Recheck sooner if needed.

## 2020-11-12 LAB — IGP, APTIMA HPV: HPV Aptima: NEGATIVE

## 2020-11-13 NOTE — Telephone Encounter (Signed)
Pt needs appt for anxiety to follow up on Valium and have uds and controlled agreement. Needs to have this when I return.   Dr Lovena Le

## 2020-11-13 NOTE — Telephone Encounter (Signed)
Patient scheduled office visit for anxiety follow up with Dr Lovena Le.

## 2020-11-13 NOTE — Telephone Encounter (Signed)
Nilda Simmer, NP    I sent in medicine for vaginitis. Can you forward her request to Dr. Lovena Le for Valium since she filled it last? Thanks, Hoyle Sauer

## 2020-11-14 NOTE — Telephone Encounter (Signed)
Pt has appt 11/21/20

## 2020-11-21 ENCOUNTER — Ambulatory Visit (INDEPENDENT_AMBULATORY_CARE_PROVIDER_SITE_OTHER): Payer: 59 | Admitting: Family Medicine

## 2020-11-21 ENCOUNTER — Other Ambulatory Visit: Payer: Self-pay

## 2020-11-21 VITALS — BP 137/88 | HR 69 | Ht 62.0 in | Wt 217.8 lb

## 2020-11-21 DIAGNOSIS — G4700 Insomnia, unspecified: Secondary | ICD-10-CM

## 2020-11-21 DIAGNOSIS — I4891 Unspecified atrial fibrillation: Secondary | ICD-10-CM

## 2020-11-21 DIAGNOSIS — E039 Hypothyroidism, unspecified: Secondary | ICD-10-CM

## 2020-11-21 DIAGNOSIS — E611 Iron deficiency: Secondary | ICD-10-CM | POA: Diagnosis not present

## 2020-11-21 DIAGNOSIS — F419 Anxiety disorder, unspecified: Secondary | ICD-10-CM

## 2020-11-21 MED ORDER — LEVOTHYROXINE SODIUM 137 MCG PO TABS: ORAL_TABLET | ORAL | 0 refills | Status: DC

## 2020-11-21 MED ORDER — DIAZEPAM 5 MG PO TABS: ORAL_TABLET | ORAL | 0 refills | Status: DC

## 2020-11-21 MED ORDER — AMLODIPINE BESYLATE 2.5 MG PO TABS: 2.5000 mg | ORAL_TABLET | Freq: Every day | ORAL | 1 refills | Status: DC

## 2020-11-21 NOTE — Progress Notes (Signed)
Patient ID: Raven Ferguson, female    DOB: 13-Jun-1960, 60 y.o.   MRN: [id number]   Chief Complaint  Patient presents with   Anxiety    Follow up   Subjective:    HPI Patient would like to discuss labs from physical and f/u anxiety. Patient also needs refills of Diazepam,  Norvasc, and levothyroxine  Not seeing heart doctor yet for new onset Afib, found recently on wellness exam going to see Dr. Harl Bowie soon.  Last visit thought had afib on 11/09/20- told to take '81mg'$  at that visit and f/u cards. Not on eliquis, due to discussion with NP Hoyle Sauer and Dr. Nicki Reaper pt low risk [place] score. Will get echo and cardiology appt soon.  Anxiety- taking paxil daily and taking valium '10mg'$ .  Parents had passed away and was having some "issues." At that time and now just taking it prn.   Some times not able to stop racing mind and irritable.  Pt is still taking Paxil '20mg'$ .  Can go up to 3wks w/o needing valium.  Occ taking at night for sleep.  Pt was given '10mg'$  valium in the past.  Last script was 12/21 for 30 tab with 2 refills.   Last fill valium 4/22 per pmp.  Smoking- has cut down on smoking. Not quit yet. Trying a tip to filter out the the tar and some nicotine.  H/o anemia- H/o gastric bypass- has had low iron since then, not on vitamin now and didn't absorb well.  Medical History Sashay has a past medical history of Hypothyroidism and Iron deficiency anemia.   Outpatient Encounter Medications as of 11/21/2020  Medication Sig   [DISCONTINUED] amLODipine (NORVASC) 2.5 MG tablet Take 2.5 mg by mouth daily.   albuterol (VENTOLIN HFA) 108 (90 Base) MCG/ACT inhaler Inhale 2 puffs into the lungs every 6 (six) hours as needed for wheezing or shortness of breath.   amLODipine (NORVASC) 2.5 MG tablet Take 1 tablet (2.5 mg total) by mouth daily.   diazepam (VALIUM) 5 MG tablet TAKE 1 TABLET BY MOUTH EVERY NIGHT AT BEDTIME AS NEEDED FOR ANXIETY   diclofenac (VOLTAREN) 75 MG EC tablet TAKE 1  TABLET(75 MG) BY MOUTH TWICE DAILY WITH A MEAL   levothyroxine (SYNTHROID) 137 MCG tablet TAKE 1 TABLET(125 MCG) BY MOUTH DAILY BEFORE BREAKFAST   OVER THE COUNTER MEDICATION Vitamin d 3 Multivitamin   pantoprazole (PROTONIX) 40 MG tablet Take 1 tablet (40 mg total) by mouth daily.   PARoxetine (PAXIL) 20 MG tablet Take 1 tablet (20 mg total) by mouth daily.   [DISCONTINUED] diazepam (VALIUM) 10 MG tablet TAKE 1 TABLET BY MOUTH EVERY NIGHT AT BEDTIME AS NEEDED FOR ANXIETY   [DISCONTINUED] levothyroxine (SYNTHROID) 125 MCG tablet TAKE 1 TABLET(125 MCG) BY MOUTH DAILY BEFORE BREAKFAST   [DISCONTINUED] metroNIDAZOLE (FLAGYL) 500 MG tablet Take 1 tablet (500 mg total) by mouth 2 (two) times daily with a meal.   No facility-administered encounter medications on file as of 11/21/2020.     Review of Systems  Constitutional:  Negative for chills and fever.  HENT:  Negative for congestion, rhinorrhea and sore throat.   Respiratory:  Negative for cough, shortness of breath and wheezing.   Cardiovascular:  Negative for chest pain and leg swelling.  Gastrointestinal:  Negative for abdominal pain, diarrhea, nausea and vomiting.  Genitourinary:  Negative for dysuria and frequency.  Musculoskeletal:  Negative for arthralgias and back pain.  Skin:  Negative for rash.  Neurological:  Negative for dizziness,  weakness and headaches.  Psychiatric/Behavioral:  Positive for sleep disturbance. Negative for dysphoric mood. The patient is not nervous/anxious.     Vitals BP 137/88   Pulse 69   Ht '5\' 2"'$  (1.575 m)   Wt 217 lb 12.8 oz (98.8 kg)   BMI 39.84 kg/m   Objective:   Physical Exam Vitals and nursing note reviewed.  Constitutional:      Appearance: Normal appearance.  HENT:     Head: Normocephalic and atraumatic.     Nose: Nose normal.     Mouth/Throat:     Mouth: Mucous membranes are moist.     Pharynx: Oropharynx is clear.  Eyes:     Extraocular Movements: Extraocular movements intact.      Conjunctiva/sclera: Conjunctivae normal.     Pupils: Pupils are equal, round, and reactive to light.  Cardiovascular:     Rate and Rhythm: Normal rate. Rhythm irregular.     Pulses: Normal pulses.     Heart sounds: Normal heart sounds.  Pulmonary:     Effort: Pulmonary effort is normal.     Breath sounds: Normal breath sounds. No wheezing, rhonchi or rales.  Musculoskeletal:        General: Normal range of motion.     Right lower leg: No edema.     Left lower leg: No edema.  Skin:    General: Skin is warm and dry.     Findings: No lesion or rash.  Neurological:     General: No focal deficit present.     Mental Status: She is alert and oriented to person, place, and time.  Psychiatric:        Mood and Affect: Mood normal.        Behavior: Behavior normal.     Assessment and Plan   1. Anxiety  2. Iron deficiency  3. Hypothyroidism, unspecified type  4. Insomnia, unspecified type  5. Atrial fibrillation, unspecified type ([place])   Afib- new H/o new onset Afib-rate controlled.  Cont with '81mg'$  asa. F/u with cardiology and echo.  Appt 01/08/21.   H/0 iron def anemia. Will give referral to heme/onc for venofer infusion if needed.  Hypothyroidism- tsh elevated. - needing to recheck TSH, inc synthroid from 11mg to 1369m.   Anxiety/insomnia- stable. cont with paxil.  Discussion of tapering down on valium. last fill valium was 4/22. Taking it prn.  Reviewed with pt that we need to start working on tapering down on the valium to '5mg'$  and that it's not a good long term solution to treat anxiety with valium '10mg'$ .  May want to increase her paxil from '20mg'$  to '30mg'$ .  Discuss with new provider.  Pt in agreement with tapering down to valium '5mg'$  and just using prn.  Return in about 2 months (around 01/21/2021) for f/u thyroid.

## 2021-01-04 ENCOUNTER — Other Ambulatory Visit: Payer: Self-pay

## 2021-01-04 ENCOUNTER — Encounter: Payer: Self-pay | Admitting: Cardiology

## 2021-01-04 ENCOUNTER — Ambulatory Visit (INDEPENDENT_AMBULATORY_CARE_PROVIDER_SITE_OTHER): Payer: 59 | Admitting: Cardiology

## 2021-01-04 VITALS — BP 138/84 | HR 63 | Ht 62.0 in | Wt 217.0 lb

## 2021-01-04 DIAGNOSIS — I4819 Other persistent atrial fibrillation: Secondary | ICD-10-CM

## 2021-01-04 DIAGNOSIS — M79604 Pain in right leg: Secondary | ICD-10-CM | POA: Diagnosis not present

## 2021-01-04 DIAGNOSIS — Z23 Encounter for immunization: Secondary | ICD-10-CM

## 2021-01-04 NOTE — Patient Instructions (Signed)
Medication Instructions:  Your physician has recommended you make the following change in your medication:  STOP Aspirin  *If you need a refill on your cardiac medications before your next appointment, please call your pharmacy*   Lab Work: None If you have labs (blood work) drawn today and your tests are completely normal, you will receive your results only by: [place] (if you have MyChart) OR A paper copy in the mail If you have any lab test that is abnormal or we need to change your treatment, we will call you to review the results.   Testing/Procedures: Your physician has requested that you have an echocardiogram. Echocardiography is a painless test that uses sound waves to create images of your heart. It provides your doctor with information about the size and shape of your heart and how well your heart's chambers and valves are working. This procedure takes approximately one hour. There are no restrictions for this procedure.  Venous Ultrasound   Follow-Up: At [facility], you and your health needs are our priority.  As part of our continuing mission to provide you with exceptional heart care, we have created designated Provider Care Teams.  These Care Teams include your primary Cardiologist (physician) and Advanced Practice Providers (APPs -  Physician Assistants and Nurse Practitioners) who all work together to provide you with the care you need, when you need it.  We recommend signing up for the patient portal called "MyChart".  Sign up information is provided on this After Visit Summary.  MyChart is used to connect with patients for Virtual Visits (Telemedicine).  Patients are able to view lab/test results, encounter notes, upcoming appointments, etc.  Non-urgent messages can be sent to your provider as well.   To learn more about what you can do with MyChart, go to [email or web address].    Your next appointment:   4 month(s)  The format for your next  appointment:   In Person  Provider:   Carlyle Dolly, MD   Other Instructions You have been referred to [facility]. They will call you with your first appointment.

## 2021-01-04 NOTE — Progress Notes (Signed)
Clinical Summary Ms. Schuchart is a 60 y.o.female seen today as a new consult, referred by NP [facility] for the following medical problems.  1.Afib - new diagnosis by pcp 10/2020 -CHADS2Vasc score is 2 (HTN, gender).  - denies any palpitations,no SOB or lightheadedness    2. Leg pain - right left pain and swelling medial to popliteal fossa   3. HTN - she is on norvasc  Past Medical History:  Diagnosis Date   Hypothyroidism    Iron deficiency anemia      No Known Allergies   Current Outpatient Medications  Medication Sig Dispense Refill   albuterol (VENTOLIN HFA) 108 (90 Base) MCG/ACT inhaler Inhale 2 puffs into the lungs every 6 (six) hours as needed for wheezing or shortness of breath. 8 g 0   amLODipine (NORVASC) 2.5 MG tablet Take 1 tablet (2.5 mg total) by mouth daily. 90 tablet 1   diazepam (VALIUM) 5 MG tablet TAKE 1 TABLET BY MOUTH EVERY NIGHT AT BEDTIME AS NEEDED FOR ANXIETY 30 tablet 0   diclofenac (VOLTAREN) 75 MG EC tablet TAKE 1 TABLET(75 MG) BY MOUTH TWICE DAILY WITH A MEAL 60 tablet 2   levothyroxine (SYNTHROID) 137 MCG tablet TAKE 1 TABLET(125 MCG) BY MOUTH DAILY BEFORE BREAKFAST 90 tablet 0   OVER THE COUNTER MEDICATION Vitamin d 3 Multivitamin     pantoprazole (PROTONIX) 40 MG tablet Take 1 tablet (40 mg total) by mouth daily. 90 tablet 1   PARoxetine (PAXIL) 20 MG tablet Take 1 tablet (20 mg total) by mouth daily. 90 tablet 1   No current facility-administered medications for this visit.     Past Surgical History:  Procedure Laterality Date   CESAREAN SECTION     COLONOSCOPY WITH ESOPHAGOGASTRODUODENOSCOPY (EGD) N/A 12/02/2012   OIN:OMVEHM post bariatric surgery. Abnormal anastomosis of doubtful clinical significance-status post biopsy (benign)/Colonic diverticulosis. Single colonic polyp-removed. Bx benign. Next TCS 11/2022.   GASTRIC BYPASS  2004   skin reduction     with hernia repair     No Known Allergies    Family History  Problem  Relation Age of Onset   Hyperlipidemia Father    Cancer Maternal Aunt        breast   Diabetes Paternal Grandmother    Heart attack Paternal Grandmother    Colon cancer Neg Hx      Social History Ms. Chiusano reports that she has been smoking cigarettes. She started smoking about 44 years ago. She has a 35.00 pack-year smoking history. She has never used smokeless tobacco. Ms. Mcpeters reports no history of alcohol use.   Review of Systems CONSTITUTIONAL: No weight loss, fever, chills, weakness or fatigue.  HEENT: Eyes: No visual loss, blurred vision, double vision or yellow sclerae.No hearing loss, sneezing, congestion, runny nose or sore throat.  SKIN: No rash or itching.  CARDIOVASCULAR: per hpi RESPIRATORY: No shortness of breath, cough or sputum.  GASTROINTESTINAL: No anorexia, nausea, vomiting or diarrhea. No abdominal pain or blood.  GENITOURINARY: No burning on urination, no polyuria NEUROLOGICAL: No headache, dizziness, syncope, paralysis, ataxia, numbness or tingling in the extremities. No change in bowel or bladder control.  MUSCULOSKELETAL: No muscle, back pain, joint pain or stiffness.  LYMPHATICS: No enlarged nodes. No history of splenectomy.  PSYCHIATRIC: No history of depression or anxiety.  ENDOCRINOLOGIC: No reports of sweating, cold or heat intolerance. No polyuria or polydipsia.  Marland Kitchen   Physical Examination Today's Vitals   01/04/21 1036  BP: 138/84  Pulse: 63  SpO2: 98%  Weight: 217 lb (98.4 kg)  Height: 5\' 2"  (1.575 m)   Body mass index is 39.69 kg/m.  Gen: resting comfortably, no acute distress HEENT: no scleral icterus, pupils equal round and reactive, no palptable cervical adenopathy,  CV: irreg, no m/r/ gno jvd Resp: Clear to auscultation bilaterally GI: abdomen is soft, non-tender, non-distended, normal bowel sounds, no hepatosplenomegaly MSK: extremities are warm, no edema.  Skin: warm, no rash Neuro:  no focal deficits Psych: appropriate  affect    Assessment and Plan  1.Afib - new diagnosis by pcp - she is self rate controlled and asymptomatic -CHADS2Vasc score is 2. In a woman could do no anticoag or anticoag. She is not in favor of anticoag at this time, would stop aspirin as recent guidline consensus would be of little benefit - discussed possible trial of DCCV in setting of newly diagnosed afib. Given she is self rate controlled, asymptomatic, and not in favor of anticoag not a strong reason to pursue at this time - obtain echo - EKG today shows rate controlled afib  2. Leg pain - right leg pain, palpable superficial cord medial to popliteal fossa. Peraps superficial vein thrombosis, will obtain US. Has had prior issues with varicose veins, refer to vascualr        Arnoldo Lenis, M.D

## 2021-01-04 NOTE — Addendum Note (Signed)
Addended by: Christella Scheuermann C on: 01/04/2021 11:29 AM   Modules accepted: Orders

## 2021-01-09 ENCOUNTER — Other Ambulatory Visit: Payer: Self-pay

## 2021-01-09 ENCOUNTER — Ambulatory Visit (HOSPITAL_COMMUNITY)
Admission: RE | Admit: 2021-01-09 | Discharge: 2021-01-09 | Disposition: A | Discharge status: Home / Self Care | Payer: 59 | Source: Ambulatory Visit | Attending: Cardiology | Admitting: Cardiology

## 2021-01-09 DIAGNOSIS — M79604 Pain in right leg: Secondary | ICD-10-CM | POA: Diagnosis present

## 2021-01-10 ENCOUNTER — Telehealth: Payer: Self-pay | Admitting: Nurse Practitioner

## 2021-01-10 ENCOUNTER — Telehealth: Payer: Self-pay | Admitting: Cardiology

## 2021-01-10 NOTE — Telephone Encounter (Signed)
Patient is requesting another round of antibiotics for vaginitis. She states has came back. Please advise

## 2021-01-10 NOTE — Telephone Encounter (Signed)
Checking percert on the following patient for testing scheduled at [facility].     ECHO   02/13/2021

## 2021-01-11 ENCOUNTER — Other Ambulatory Visit: Payer: Self-pay | Admitting: Nurse Practitioner

## 2021-01-11 MED ORDER — METRONIDAZOLE 500 MG PO TABS: 500.0000 mg | ORAL_TABLET | Freq: Two times a day (BID) | ORAL | 0 refills | Status: DC

## 2021-01-11 NOTE — Telephone Encounter (Signed)
Patient notified

## 2021-01-11 NOTE — Telephone Encounter (Signed)
   Nilda Simmer, NP    Done. Ask her to call back if persists. Thanks.      Telephone call- Mailbox is full

## 2021-01-17 ENCOUNTER — Telehealth: Payer: Self-pay | Admitting: Cardiology

## 2021-01-17 ENCOUNTER — Encounter: Payer: Self-pay | Admitting: *Deleted

## 2021-01-17 NOTE — Telephone Encounter (Signed)
Attempted to reach patient - mail box is full.     Laurine Blazer, LPN  41/93/7902  4:09 PM EDT     Mailbox is full.     Patient notified of results by letter.   Laurine Blazer, LPN  73/53/2992  4:26 PM EDT     Correction, mailbox is full.     Laurine Blazer, LPN  83/41/9622  2:97 PM EDT     Left message to return call.   Arnoldo Lenis, MD  01/14/2021  4:19 PM EDT     US shows just some inflammation of the small veins, no blood clots. Treatment is just warm compresses, does not require any blood thinner for this   Zandra Abts MD

## 2021-01-17 NOTE — Telephone Encounter (Signed)
Patient returning call for test results

## 2021-02-12 ENCOUNTER — Other Ambulatory Visit: Payer: Self-pay | Admitting: Family Medicine

## 2021-02-13 ENCOUNTER — Other Ambulatory Visit: Payer: Self-pay

## 2021-02-13 ENCOUNTER — Ambulatory Visit (HOSPITAL_COMMUNITY)
Admission: RE | Admit: 2021-02-13 | Discharge: 2021-02-13 | Disposition: A | Discharge status: Home / Self Care | Payer: 59 | Source: Ambulatory Visit | Attending: Cardiology | Admitting: Cardiology

## 2021-02-13 DIAGNOSIS — I4819 Other persistent atrial fibrillation: Secondary | ICD-10-CM | POA: Diagnosis not present

## 2021-02-13 LAB — ECHOCARDIOGRAM COMPLETE
AR max vel: 1.72 cm2
AV Area VTI: 1.9 cm2
AV Area mean vel: 2.05 cm2
AV Mean grad: 3.1 mmHg
AV Peak grad: 7 mmHg
Ao pk vel: 1.32 m/s
Area-P 1/2: 3.53 cm2
S' Lateral: 3.1 cm

## 2021-02-13 NOTE — Progress Notes (Signed)
*  PRELIMINARY RESULTS* Echocardiogram 2D Echocardiogram has been performed.  Samuel Germany 02/13/2021, 11:00 AM

## 2021-02-18 ENCOUNTER — Encounter: Payer: Self-pay | Admitting: *Deleted

## 2021-03-06 ENCOUNTER — Other Ambulatory Visit: Payer: Self-pay | Admitting: Family Medicine

## 2021-03-26 ENCOUNTER — Other Ambulatory Visit: Payer: Self-pay | Admitting: General Surgery

## 2021-03-27 ENCOUNTER — Encounter (HOSPITAL_COMMUNITY): Payer: 59

## 2021-03-27 NOTE — Telephone Encounter (Signed)
Na

## 2021-04-08 ENCOUNTER — Encounter (HOSPITAL_COMMUNITY): Payer: Self-pay

## 2021-04-08 NOTE — Progress Notes (Addendum)
Surgical Instructions    Your procedure is scheduled on 04/16/21.  Report to [facility] Main Entrance "A" at 8:00 A.M., then check in with the Admitting office.  Call this number if you have problems the morning of surgery:  [phone]   If you have any questions prior to your surgery date call [phone]: Open Monday-Friday 8am-4pm    Remember:  Do not eat after midnight the night before your surgery  You may drink clear liquids until 7:00 the morning of your surgery.   Clear liquids allowed are: Water, Non-Citrus Juices (without pulp), Carbonated Beverages, Clear Tea, Black Coffee ONLY (NO MILK, CREAM OR POWDERED CREAMER of any kind), and Gatorade  Please complete your PRE-SURGERY ENSURE that was provided to you by 7:00 the morning of surgery.  Please, if able, drink it in one setting. DO NOT SIP.     Take these medicines the morning of surgery with A SIP OF WATER:  amLODipine (NORVASC) levothyroxine (SYNTHROID) PARoxetine (PAXIL)   As Needed: albuterol (VENTOLIN HFA) pantoprazole (PROTONIX)    As of today, STOP taking any Aspirin (unless otherwise instructed by your surgeon) Aleve, Naproxen, Ibuprofen, Motrin, Advil, Goody's, BC's, all herbal medications, fish oil, and all vitamins.            Do not wear jewelry or makeup Do not wear lotions, powders, perfumes or deodorant. Do not shave 48 hours prior to surgery.   Do not bring valuables to the hospital. DO Not wear nail polish, gel polish, artificial nails, or any other type of covering on natural nails including finger and toenails. If patients have artificial nails, gel coating, etc. that need to be removed by a nail salon, please have this removed prior to surgery or surgery may need to be canceled/delayed if the surgeon/ anesthesia feels like the patient is unable to be adequately monitored.             [facility] is not responsible for any belongings or valuables.  Do NOT Smoke (Tobacco/Vaping)  24 hours prior  to your procedure  If you use a CPAP at night, you may bring your mask for your overnight stay.   Contacts, glasses, hearing aids, dentures or partials may not be worn into surgery, please bring cases for these belongings   For patients admitted to the hospital, discharge time will be determined by your treatment team.   Patients discharged the day of surgery will not be allowed to drive home, and someone needs to stay with them for 24 hours.  NO VISITORS WILL BE ALLOWED IN PRE-OP WHERE PATIENTS ARE PREPPED FOR SURGERY.  ONLY 1 SUPPORT PERSON MAY BE PRESENT IN THE WAITING ROOM WHILE YOU ARE IN SURGERY.  IF YOU ARE TO BE ADMITTED, ONCE YOU ARE IN YOUR ROOM YOU WILL BE ALLOWED TWO (2) VISITORS. 1 (ONE) VISITOR MAY STAY OVERNIGHT BUT MUST ARRIVE TO THE ROOM BY 8pm.  Minor children may have two parents present. Special consideration for safety and communication needs will be reviewed on a case by case basis.  Special instructions:    Oral Hygiene is also important to reduce your risk of infection.  Remember - BRUSH YOUR TEETH THE MORNING OF SURGERY WITH YOUR REGULAR TOOTHPASTE   [place]- Preparing For Surgery  Before surgery, you can play an important role. Because skin is not sterile, your skin needs to be as free of germs as possible. You can reduce the number of germs on your skin by washing with CHG (chlorahexidine  gluconate) Soap before surgery.  CHG is an antiseptic cleaner which kills germs and bonds with the skin to continue killing germs even after washing.     Please do not use if you have an allergy to CHG or antibacterial soaps. If your skin becomes reddened/irritated stop using the CHG.  Do not shave (including legs and underarms) for at least 48 hours prior to first CHG shower. It is OK to shave your face.  Please follow these instructions carefully.     Shower the NIGHT BEFORE SURGERY and the MORNING OF SURGERY with CHG Soap.   If you chose to wash your hair, wash your  hair first as usual with your normal shampoo. After you shampoo, rinse your hair and body thoroughly to remove the shampoo.  Then [employer] and genitals (private parts) with your normal soap and rinse thoroughly to remove soap.  After that Use CHG Soap as you would any other liquid soap. You can apply CHG directly to the skin and wash gently with a scrungie or a clean washcloth.   Apply the CHG Soap to your body ONLY FROM THE NECK DOWN.  Do not use on open wounds or open sores. Avoid contact with your eyes, ears, mouth and genitals (private parts). Wash Face and genitals (private parts)  with your normal soap.   Wash thoroughly, paying special attention to the area where your surgery will be performed.  Thoroughly rinse your body with warm water from the neck down.  DO NOT shower/wash with your normal soap after using and rinsing off the CHG Soap.  Pat yourself dry with a CLEAN TOWEL.  Wear CLEAN PAJAMAS to bed the night before surgery  Place CLEAN SHEETS on your bed the night before your surgery  DO NOT SLEEP WITH PETS.   Day of Surgery:  Take a shower with CHG soap. Wear Clean/Comfortable clothing the morning of surgery Do not apply any deodorants/lotions.   Remember to brush your teeth WITH YOUR REGULAR TOOTHPASTE.   Please read over the following fact sheets that you were given.

## 2021-04-09 ENCOUNTER — Encounter (HOSPITAL_COMMUNITY)
Admission: RE | Admit: 2021-04-09 | Discharge: 2021-04-09 | Disposition: A | Discharge status: Home / Self Care | Payer: BC Managed Care – PPO | Source: Ambulatory Visit | Attending: General Surgery | Admitting: General Surgery

## 2021-04-09 ENCOUNTER — Encounter (HOSPITAL_COMMUNITY): Payer: Self-pay

## 2021-04-09 ENCOUNTER — Other Ambulatory Visit: Payer: Self-pay

## 2021-04-09 VITALS — BP 156/81 | HR 72 | Temp 98.1°F | Resp 20 | Ht 65.0 in | Wt 217.3 lb

## 2021-04-09 DIAGNOSIS — Z01812 Encounter for preprocedural laboratory examination: Secondary | ICD-10-CM | POA: Insufficient documentation

## 2021-04-09 DIAGNOSIS — K439 Ventral hernia without obstruction or gangrene: Secondary | ICD-10-CM | POA: Diagnosis not present

## 2021-04-09 DIAGNOSIS — Z6836 Body mass index (BMI) 36.0-36.9, adult: Secondary | ICD-10-CM | POA: Diagnosis not present

## 2021-04-09 DIAGNOSIS — D509 Iron deficiency anemia, unspecified: Secondary | ICD-10-CM | POA: Diagnosis not present

## 2021-04-09 DIAGNOSIS — K219 Gastro-esophageal reflux disease without esophagitis: Secondary | ICD-10-CM | POA: Diagnosis not present

## 2021-04-09 DIAGNOSIS — I4891 Unspecified atrial fibrillation: Secondary | ICD-10-CM | POA: Insufficient documentation

## 2021-04-09 DIAGNOSIS — Z87891 Personal history of nicotine dependence: Secondary | ICD-10-CM | POA: Insufficient documentation

## 2021-04-09 DIAGNOSIS — Z01818 Encounter for other preprocedural examination: Secondary | ICD-10-CM

## 2021-04-09 DIAGNOSIS — E669 Obesity, unspecified: Secondary | ICD-10-CM | POA: Insufficient documentation

## 2021-04-09 DIAGNOSIS — F419 Anxiety disorder, unspecified: Secondary | ICD-10-CM | POA: Insufficient documentation

## 2021-04-09 DIAGNOSIS — E039 Hypothyroidism, unspecified: Secondary | ICD-10-CM | POA: Insufficient documentation

## 2021-04-09 DIAGNOSIS — I10 Essential (primary) hypertension: Secondary | ICD-10-CM | POA: Diagnosis not present

## 2021-04-09 DIAGNOSIS — C4911 Malignant neoplasm of connective and soft tissue of right upper limb, including shoulder: Secondary | ICD-10-CM | POA: Insufficient documentation

## 2021-04-09 HISTORY — DX: Gastro-esophageal reflux disease without esophagitis: K21.9

## 2021-04-09 HISTORY — DX: Cardiac arrhythmia, unspecified: I49.9

## 2021-04-09 HISTORY — DX: Other complications of anesthesia, initial encounter: T88.59XA

## 2021-04-09 HISTORY — DX: Anxiety disorder, unspecified: F41.9

## 2021-04-09 HISTORY — DX: Malignant (primary) neoplasm, unspecified: C80.1

## 2021-04-09 HISTORY — DX: Nausea with vomiting, unspecified: R11.2

## 2021-04-09 HISTORY — DX: Other specified postprocedural states: Z98.890

## 2021-04-09 HISTORY — DX: Personal history of urinary calculi: Z87.442

## 2021-04-09 HISTORY — DX: Essential (primary) hypertension: I10

## 2021-04-09 LAB — BASIC METABOLIC PANEL
Anion gap: 9 (ref 5–15)
BUN: 7 mg/dL (ref 6–20)
CO2: 25 mmol/L (ref 22–32)
Calcium: 9.1 mg/dL (ref 8.9–10.3)
Chloride: 105 mmol/L (ref 98–111)
Creatinine, Ser: 0.68 mg/dL (ref 0.44–1.00)
GFR, Estimated: >60 mL/min (ref >60)
Glucose, Bld: 68 mg/dL — ABNORMAL LOW (ref 70–99)
Potassium: 4 mmol/L (ref 3.5–5.1)
Sodium: 139 mmol/L (ref 135–145)

## 2021-04-09 LAB — CBC
HCT: 39.1 % (ref 36.0–46.0)
Hemoglobin: 11.7 g/dL — ABNORMAL LOW (ref 12.0–15.0)
MCH: 23.8 pg — ABNORMAL LOW (ref 26.0–34.0)
MCHC: 29.9 g/dL — ABNORMAL LOW (ref 30.0–36.0)
MCV: 79.6 fL — ABNORMAL LOW (ref 80.0–100.0)
Platelets: 230 10*3/uL (ref 150–400)
RBC: 4.91 MIL/uL (ref 3.87–5.11)
RDW: 16 % — ABNORMAL HIGH (ref 11.5–15.5)
WBC: 5.2 10*3/uL (ref 4.0–10.5)
nRBC: 0 % (ref 0.0–0.2)

## 2021-04-09 NOTE — Progress Notes (Addendum)
PCP: Thersa Salt, DO Cardiologist: Carlyle Dolly, MD  EKG: 01/04/21 CXR: 01/07/17 ECHO: 02/13/21.  Note states echo was normal Stress Test: denies Cardiac Cath: denies  Fasting Blood Sugar- na Checks Blood Sugar_na__ times a day  ASA/Blood Thinner: No  OSA/CPAP: No  Covid test not needed - Ambulatory  Anesthesia Review: Yes, afib.  Last office note requested from Dr. Harl Bowie.  Patient denies shortness of breath, fever, cough, and chest pain at PAT appointment.  Patient verbalized understanding of instructions provided today at the PAT appointment.  Patient asked to review instructions at home and day of surgery.

## 2021-04-11 ENCOUNTER — Encounter (HOSPITAL_COMMUNITY): Payer: Self-pay

## 2021-04-11 NOTE — Anesthesia Preprocedure Evaluation (Addendum)
Anesthesia Evaluation  Patient identified by MRN, date of birth, ID band Patient awake    Reviewed: Allergy & Precautions, NPO status , Patient's Chart, lab work & pertinent test results  History of Anesthesia Complications (+) PONV and history of anesthetic complications  Airway Mallampati: I       Dental no notable dental hx.    Pulmonary Current Smoker and Patient abstained from smoking.,    Pulmonary exam normal        Cardiovascular hypertension, Pt. on medications + dysrhythmias Atrial Fibrillation  Rhythm:Irregular Rate:Normal     Neuro/Psych PSYCHIATRIC DISORDERS Anxiety negative neurological ROS     GI/Hepatic Neg liver ROS, GERD  Medicated,  Endo/Other  Hypothyroidism   Renal/GU negative Renal ROS     Musculoskeletal negative musculoskeletal ROS (+)   Abdominal (+) + obese,   Peds  Hematology   Anesthesia Other Findings Echo overall looks good, normal heart function   Reproductive/Obstetrics                            Anesthesia Physical Anesthesia Plan  ASA: 3  Anesthesia Plan: General   Post-op Pain Management:    Induction: Intravenous  PONV Risk Score and Plan: 4 or greater and Ondansetron, Dexamethasone and Midazolam  Airway Management Planned: LMA  Additional Equipment: None  Intra-op Plan:   Post-operative Plan: Extubation in OR  Informed Consent: I have reviewed the patients History and Physical, chart, labs and discussed the procedure including the risks, benefits and alternatives for the proposed anesthesia with the patient or authorized representative who has indicated his/her understanding and acceptance.     Dental advisory given  Plan Discussed with: CRNA  Anesthesia Plan Comments: (PAT note written 04/11/2021 by Myra Gianotti, PA-C. )       Anesthesia Quick Evaluation

## 2021-04-11 NOTE — Progress Notes (Signed)
Anesthesia Chart Review:  Case: [id number] Date/Time: 04/16/21 0945   Procedure: WIDE LOCAL EXCISION WITH ADVANCEMENT FLAP CLOSURE RIGHT UPPER EXTREMITY (Right)   Anesthesia type: General   Pre-op diagnosis: SQUAMOUS CELL CARCINOMA   Location: [facility] OR ROOM 02 / [place] [facility]   Surgeons: Stark Klein, MD       DISCUSSION: Patient is a 61 year old female scheduled for the above procedure.  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  Patient desired reexcision. Per Dr. Marlowe Aschoff 03/26/21 note, "will plan to excise the scar will 5 mm margin. This will be true on both forearms. I discussed that I would go all the way down to the muscle to minimize risk of recurrence.Marland KitchenMarland KitchenI do think these are large enough areas that she will likely need general anesthesia as it would meet her maximal of local anesthetic."  History includes smoking, post-operative N/V, dysrhythmia (afib diagnosed 10/2020, anticoagulation deferred), HTN, GERD, iron deficiency anemia, skin cancer ([facility]CC, recurrences BUE), gastric bypass (2004), hypothyroidism, ventral incisional hernia. BMI is consistent with obesity  She was evaluated by cardiologist Dr. Harl Bowie on 01/04/21 for new onset afib per PCP in 10/2020. Afib was rate controlled, and she was asymptomatic. CHADS2Vasc score 2. His note suggests that he discussed anticoagulation or no anticoagulation as reasonable options. She did not wish to start anticoagulation. Given rate control, asymptomatic, desire not to be on anticoagulation, he did not feel there a a strong reason to pursue cardioversion. Echo ordered which showed normal LVEF, no regional wall motion abnormalities, severely dilated LA, normal RVSF, normal PASP. He wrote echo "overall looks good, normal heart function".  Routine 36-month office follow-up planned. HR 72 bpm at 04/09/21 PAT, so remains rate controlled. She denied SOB and chest pain.  Anesthesia team to evaluate on the day of  surgery.    VS: BP (!) 156/81    Pulse 72    Temp 36.7 C (Oral)    Resp 20    Ht 5\' 5"  (1.651 m)    Wt 98.6 kg    SpO2 100%    BMI 36.16 kg/m   PROVIDERS: [name], DO is listed as PCP. Last visit on 11/21/20 with Elvia Collum, DO for anxiety follow-up. [facility])  Carlyle Dolly, MD is cardiologist   LABS: Labs reviewed: Acceptable for surgery. LFTs normal 11/09/20.  (all labs ordered are listed, but only abnormal results are displayed)  Labs Reviewed  CBC - Abnormal; Notable for the following components:      Result Value   Hemoglobin 11.7 (*)    MCV 79.6 (*)    MCH 23.8 (*)    MCHC 29.9 (*)    RDW 16.0 (*)    All other components within normal limits  BASIC METABOLIC PANEL - Abnormal; Notable for the following components:   Glucose, Bld 68 (*)    All other components within normal limits    EKG: 01/04/21 ([facility]): Afib at 63 bpm   CV: Echo 02/13/21: IMPRESSIONS   1. Left ventricular ejection fraction, by estimation, is 60 to 65%. The  left ventricle has normal function. The left ventricle has no regional  wall motion abnormalities. Left ventricular diastolic parameters are  indeterminate.   2. Right ventricular systolic function is normal. The right ventricular  size is normal. There is normal pulmonary artery systolic pressure.   3. Left atrial size was severely dilated.   4. Right atrial size was mildly dilated.   5. The mitral  valve is normal in structure. No evidence of mitral valve  regurgitation. No evidence of mitral stenosis.   6. The tricuspid valve is abnormal.   7. The aortic valve has an indeterminant number of cusps. Aortic valve  regurgitation is not visualized. No aortic stenosis is present.   8. The inferior vena cava is normal in size with greater than 50%  respiratory variability, suggesting right atrial pressure of 3 mmHg.    RLE Venous US 01/09/21: IMPRESSION: 1. No evidence of right lower extremity deep  vein thrombosis. 2. Superficial thrombophlebitis of the right great saphenous vein as well as communicating varicosities centered in the medial knee region. - Results reviewed by Dr. Harl Bowie with recommendation for warm compresses, no blood thinners.  Past Medical History:  Diagnosis Date   Anxiety    Cancer ([place])    Complication of anesthesia    Dysrhythmia 11/09/2020   afib   GERD (gastroesophageal reflux disease)    History of kidney stones    Hypertension    Hypothyroidism    Iron deficiency anemia    PONV (postoperative nausea and vomiting)     Past Surgical History:  Procedure Laterality Date   CESAREAN SECTION     CHOLECYSTECTOMY     COLONOSCOPY WITH ESOPHAGOGASTRODUODENOSCOPY (EGD) N/A 12/02/2012   IRW:ERXVQM post bariatric surgery. Abnormal anastomosis of doubtful clinical significance-status post biopsy (benign)/Colonic diverticulosis. Single colonic polyp-removed. Bx benign. Next TCS 11/2022.   GASTRIC BYPASS  03/24/2002   skin reduction     with hernia repair   TONSILLECTOMY      MEDICATIONS:  albuterol (VENTOLIN HFA) 108 (90 Base) MCG/ACT inhaler   amLODipine (NORVASC) 2.5 MG tablet   diazepam (VALIUM) 5 MG tablet   diclofenac (VOLTAREN) 75 MG EC tablet   levothyroxine (SYNTHROID) 137 MCG tablet   metroNIDAZOLE (FLAGYL) 500 MG tablet   pantoprazole (PROTONIX) 40 MG tablet   PARoxetine (PAXIL) 20 MG tablet   No current facility-administered medications for this encounter.    Myra Gianotti, PA-C Surgical Short Stay/Anesthesiology [facility] Phone [phone] [facility] Phone [phone] 04/11/2021 2:32 PM

## 2021-04-15 NOTE — H&P (Signed)
REFERRING PHYSICIAN:  Allyn Kenner   PROVIDER:  Georgianne Fick, MD   MRN: [id number] DOB: [birth year] DATE OF ENCOUNTER: 03/26/2021 Subjective  Chief Complaint: Squamous Cell Carcinoma     History of Present Illness: Raven Ferguson is a 61 y.o. female who is seen today as an office consultation at the request of Dr. 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  Most recently, she has had recurrences in the upper forearms.  The right side has recurred twice and the left side has recurred once.  She did have wide local excisions by dermatology on both the sides with negative margins and despite this had recurrence.  She desires excision again.  She states that both of these areas hurt.  She denies bleeding.    Pathology accession numbers [facility] [id number] and [id number]   Review of Systems: A complete review of systems was obtained from the patient.  I have reviewed this information and discussed as appropriate with the patient.  See HPI as well for other ROS.   Review of Systems  All other systems reviewed and are negative.     Medical History: Past Medical History      Past Medical History:  Diagnosis Date   Anemia     Anxiety     Thyroid disease             Patient Active Problem List  Diagnosis   Squamous cell cancer of skin of left forearm   Squamous cell cancer of skin of right forearm   Ventral incisional hernia      Past Surgical History  History reviewed. No pertinent surgical history.      Allergies  No Known Allergies           Current Outpatient Medications on File Prior to Visit  Medication Sig Dispense Refill   amLODIPine (NORVASC) 2.5 MG tablet         levothyroxine (SYNTHROID) 137 MCG tablet TAKE 1 TABLET BY MOUTH DAILY BEFORE BREAKFAST       PARoxetine (PAXIL) 20 MG tablet          No current  facility-administered medications on file prior to visit.      Family History       Family History  Problem Relation Age of Onset   Coronary Artery Disease (Blocked arteries around heart) Father     Diabetes Father          Social History        Tobacco Use  Smoking Status Every Day   Types: Cigarettes  Smokeless Tobacco Never      Social History  Social History         Socioeconomic History   Marital status: Married  Tobacco Use   Smoking status: Every Day      Types: Cigarettes   Smokeless tobacco: Never  Substance and Sexual Activity   Alcohol use: Never   Drug use: Never        Objective:       Vitals:    03/26/21 1341  BP: 138/88  Pulse: 100  Temp: 36.7 C (98.1 F)  SpO2: 99%  Weight: 98.2 kg (216 lb 9.6 oz)  Height: 162.6 cm (5\' 4" )    Body mass index is 37.18 kg/m.     Head:   Normocephalic and atraumatic.  Mouth/Throat: Oropharynx is clear  and moist. No oropharyngeal exudate.  Eyes:   Conjunctivae are normal. Pupils are equal, round, and reactive to light. No scleral icterus.  Neck:   Normal range of motion. Neck supple. No tracheal deviation present. No thyromegaly present.  Cardiovascular: Normal rate, regular rhythm, intact distal pulses.   No murmur heard. Respiratory: Effort normal. No respiratory distress.  No chest wall tenderness.  GI:       Soft. Bowel sounds are normal. Abdomen is soft, non tender, non distended.  No masses or hepatosplenomegaly is present. There is no rebound and no guarding.  Musculoskeletal: . Extremities are non tender and without deformity.  Lymphadenopathy:   No cervical or axillary adenopathy.  Neurological: Alert and oriented to person, place, and time. Coordination normal.  Skin:    Skin is warm and dry. No rash noted. No diaphoresis. No erythema. No pallor.  Right forearm with heaped up 8 mm area in prior 3.5 cm incision, left forearm with heaped up area around 1 cm in 4 cm incision. Both have shave biopsy  sites and there are several other biopsy sites on both forearms.   Psychiatric: Normal mood and affect.Behavior is normal. Judgment and thought content normal.    Labs, Imaging and Diagnostic Testing: n/a   Assessment and Plan:     Diagnoses and all orders for this visit:   Squamous cell cancer of skin of left forearm   Squamous cell cancer of skin of right forearm     Both of these are recurrent and are in scars.  I will plan to excise the scar will 5 mm margin.  This will be true on both forearms.  I discussed that I would go all the way down to the muscle to minimize risk of recurrence.  I discussed the main risk of surgery is wound breakdown.  She is a smoker although she has cut back.  I discussed that smoking cessation would decrease her risk of wound issues.  I reviewed the risk of prolonged infection, wound dehiscence, wound bleeding, and more.  I do think these are large enough areas that she will likely need general anesthesia as it would meet her maximal of local anesthetic.   We will schedule this at the first available opportunity.   No follow-ups on file. Georgianne Fick, MD

## 2021-04-16 ENCOUNTER — Encounter (HOSPITAL_COMMUNITY): Payer: Self-pay | Admitting: General Surgery

## 2021-04-16 ENCOUNTER — Ambulatory Visit (HOSPITAL_COMMUNITY)
Admission: RE | Admit: 2021-04-16 | Discharge: 2021-04-16 | Disposition: A | Discharge status: Home / Self Care | Payer: BC Managed Care – PPO | Attending: General Surgery | Admitting: General Surgery

## 2021-04-16 ENCOUNTER — Other Ambulatory Visit: Payer: Self-pay

## 2021-04-16 ENCOUNTER — Encounter (HOSPITAL_COMMUNITY)
Admission: RE | Disposition: A | Discharge status: Home / Self Care | Payer: Self-pay | Source: Home / Self Care | Attending: General Surgery

## 2021-04-16 ENCOUNTER — Ambulatory Visit (HOSPITAL_COMMUNITY): Payer: BC Managed Care – PPO | Admitting: Anesthesiology

## 2021-04-16 ENCOUNTER — Ambulatory Visit (HOSPITAL_COMMUNITY): Payer: BC Managed Care – PPO | Admitting: Vascular Surgery

## 2021-04-16 DIAGNOSIS — C44622 Squamous cell carcinoma of skin of right upper limb, including shoulder: Secondary | ICD-10-CM | POA: Diagnosis not present

## 2021-04-16 DIAGNOSIS — Z6837 Body mass index (BMI) 37.0-37.9, adult: Secondary | ICD-10-CM | POA: Diagnosis not present

## 2021-04-16 DIAGNOSIS — L578 Other skin changes due to chronic exposure to nonionizing radiation: Secondary | ICD-10-CM | POA: Diagnosis not present

## 2021-04-16 DIAGNOSIS — E039 Hypothyroidism, unspecified: Secondary | ICD-10-CM | POA: Diagnosis not present

## 2021-04-16 DIAGNOSIS — E669 Obesity, unspecified: Secondary | ICD-10-CM | POA: Insufficient documentation

## 2021-04-16 DIAGNOSIS — F419 Anxiety disorder, unspecified: Secondary | ICD-10-CM | POA: Diagnosis not present

## 2021-04-16 DIAGNOSIS — I10 Essential (primary) hypertension: Secondary | ICD-10-CM | POA: Insufficient documentation

## 2021-04-16 DIAGNOSIS — L570 Actinic keratosis: Secondary | ICD-10-CM | POA: Diagnosis not present

## 2021-04-16 DIAGNOSIS — F17210 Nicotine dependence, cigarettes, uncomplicated: Secondary | ICD-10-CM | POA: Insufficient documentation

## 2021-04-16 DIAGNOSIS — K219 Gastro-esophageal reflux disease without esophagitis: Secondary | ICD-10-CM | POA: Diagnosis not present

## 2021-04-16 DIAGNOSIS — C44629 Squamous cell carcinoma of skin of left upper limb, including shoulder: Secondary | ICD-10-CM | POA: Diagnosis present

## 2021-04-16 DIAGNOSIS — I4891 Unspecified atrial fibrillation: Secondary | ICD-10-CM | POA: Insufficient documentation

## 2021-04-16 HISTORY — PX: ABDOMINAL ADVANCEMENT FLAP: SHX6151

## 2021-04-16 SURGERY — PROCEDURE, ADVANCEMENT FLAP, ABDOMEN
Anesthesia: General | Site: Arm Lower | Laterality: Bilateral

## 2021-04-16 MED ORDER — ACETAMINOPHEN 500 MG PO TABS: 1000.0000 mg | ORAL_TABLET | ORAL | Status: AC

## 2021-04-16 MED ORDER — PROMETHAZINE HCL 25 MG/ML IJ SOLN: 6.2500 mg | INTRAMUSCULAR | Status: DC | PRN

## 2021-04-16 MED ORDER — LACTATED RINGERS IV SOLN: INTRAVENOUS | Status: DC

## 2021-04-16 MED ORDER — CEFAZOLIN SODIUM-DEXTROSE 2-4 GM/100ML-% IV SOLN
2.0000 g | INTRAVENOUS | Status: AC
  Administered 2021-04-16: 11:00:00 2 g via INTRAVENOUS

## 2021-04-16 MED ORDER — CHLORHEXIDINE GLUCONATE CLOTH 2 % EX PADS: 6.0000 | MEDICATED_PAD | Freq: Once | CUTANEOUS | Status: DC

## 2021-04-16 MED ORDER — CHLORHEXIDINE GLUCONATE 0.12 % MT SOLN
15.0000 mL | Freq: Once | OROMUCOSAL | Status: AC
Start: 2021-04-16 — End: 2021-04-16
  Administered 2021-04-16: 09:00:00 15 mL via OROMUCOSAL
  Filled 2021-04-16: qty 15

## 2021-04-16 MED ORDER — FENTANYL CITRATE (PF) 250 MCG/5ML IJ SOLN
INTRAMUSCULAR | Status: DC | PRN
  Administered 2021-04-16: 25 ug via INTRAVENOUS
  Administered 2021-04-16: 50 ug via INTRAVENOUS
  Administered 2021-04-16: 25 ug via INTRAVENOUS

## 2021-04-16 MED ORDER — PROPOFOL 10 MG/ML IV BOLUS
INTRAVENOUS | Status: DC | PRN
  Administered 2021-04-16: 170 mg via INTRAVENOUS

## 2021-04-16 MED ORDER — ORAL CARE MOUTH RINSE
15.0000 mL | Freq: Once | OROMUCOSAL | Status: AC
Start: 2021-04-16 — End: 2021-04-16

## 2021-04-16 MED ORDER — ACETAMINOPHEN 10 MG/ML IV SOLN: 1000.0000 mg | Freq: Once | INTRAVENOUS | Status: DC | PRN

## 2021-04-16 MED ORDER — BUPIVACAINE-EPINEPHRINE (PF) 0.25% -1:200000 IJ SOLN
INTRAMUSCULAR | Status: AC
  Filled 2021-04-16: qty 30

## 2021-04-16 MED ORDER — ACETAMINOPHEN 500 MG PO TABS
ORAL_TABLET | ORAL | Status: AC
  Administered 2021-04-16: 09:00:00 1000 mg via ORAL
  Filled 2021-04-16: qty 2

## 2021-04-16 MED ORDER — LIDOCAINE HCL (PF) 1 % IJ SOLN
INTRAMUSCULAR | Status: AC
  Filled 2021-04-16: qty 30

## 2021-04-16 MED ORDER — ONDANSETRON HCL 4 MG/2ML IJ SOLN
INTRAMUSCULAR | Status: DC | PRN
  Administered 2021-04-16: 4 mg via INTRAVENOUS

## 2021-04-16 MED ORDER — MIDAZOLAM HCL 2 MG/2ML IJ SOLN
INTRAMUSCULAR | Status: DC | PRN
  Administered 2021-04-16: 2 mg via INTRAVENOUS

## 2021-04-16 MED ORDER — FENTANYL CITRATE (PF) 250 MCG/5ML IJ SOLN
INTRAMUSCULAR | Status: AC
  Filled 2021-04-16: qty 5

## 2021-04-16 MED ORDER — HYDROMORPHONE HCL 1 MG/ML IJ SOLN: 0.2500 mg | INTRAMUSCULAR | Status: DC | PRN

## 2021-04-16 MED ORDER — LIDOCAINE HCL (PF) 1 % IJ SOLN
INTRAMUSCULAR | Status: DC | PRN
  Administered 2021-04-16: 20 mL

## 2021-04-16 MED ORDER — LACTATED RINGERS IV SOLN: INTRAVENOUS | Status: DC | PRN

## 2021-04-16 MED ORDER — MEPERIDINE HCL 25 MG/ML IJ SOLN: 6.2500 mg | INTRAMUSCULAR | Status: DC | PRN

## 2021-04-16 MED ORDER — MIDAZOLAM HCL 2 MG/2ML IJ SOLN
INTRAMUSCULAR | Status: AC
  Filled 2021-04-16: qty 2

## 2021-04-16 MED ORDER — OXYCODONE HCL 5 MG PO TABS: 5.0000 mg | ORAL_TABLET | Freq: Four times a day (QID) | ORAL | 0 refills | Status: DC | PRN

## 2021-04-16 MED ORDER — ONDANSETRON HCL 4 MG/2ML IJ SOLN
INTRAMUSCULAR | Status: AC
  Filled 2021-04-16: qty 2

## 2021-04-16 MED ORDER — DEXAMETHASONE SODIUM PHOSPHATE 10 MG/ML IJ SOLN
INTRAMUSCULAR | Status: DC | PRN
  Administered 2021-04-16: 10 mg via INTRAVENOUS

## 2021-04-16 MED ORDER — 0.9 % SODIUM CHLORIDE (POUR BTL) OPTIME
TOPICAL | Status: DC | PRN
  Administered 2021-04-16: 12:00:00 200 mL

## 2021-04-16 MED ORDER — BUPIVACAINE-EPINEPHRINE (PF) 0.25% -1:200000 IJ SOLN
INTRAMUSCULAR | Status: DC | PRN
  Administered 2021-04-16: 20 mL

## 2021-04-16 MED ORDER — LIDOCAINE 2% (20 MG/ML) 5 ML SYRINGE
INTRAMUSCULAR | Status: DC | PRN
  Administered 2021-04-16: 100 mg via INTRAVENOUS

## 2021-04-16 MED ORDER — CEFAZOLIN SODIUM-DEXTROSE 2-4 GM/100ML-% IV SOLN
INTRAVENOUS | Status: AC
  Filled 2021-04-16: qty 100

## 2021-04-16 SURGICAL SUPPLY — 57 items
ADH SKN CLS APL DERMABOND .7 (GAUZE/BANDAGES/DRESSINGS)
APL PRP STRL LF DISP 70% ISPRP (MISCELLANEOUS) ×3
APL SKNCLS STERI-STRIP NONHPOA (GAUZE/BANDAGES/DRESSINGS) ×2
BAG COUNTER SPONGE SURGICOUNT (BAG) ×2 IMPLANT
BAG SPNG CNTER NS LX DISP (BAG) ×1
BENZOIN TINCTURE PRP APPL 2/3 (GAUZE/BANDAGES/DRESSINGS) ×4 IMPLANT
BNDG COHESIVE 4X5 TAN ST LF (GAUZE/BANDAGES/DRESSINGS) ×1 IMPLANT
CANISTER SUCT 3000ML PPV (MISCELLANEOUS) ×3 IMPLANT
CHLORAPREP W/TINT 26 (MISCELLANEOUS) ×5 IMPLANT
COVER MAYO STAND STRL (DRAPES) ×2 IMPLANT
COVER SURGICAL LIGHT HANDLE (MISCELLANEOUS) ×3 IMPLANT
DERMABOND ADVANCED (GAUZE/BANDAGES/DRESSINGS)
DERMABOND ADVANCED .7 DNX12 (GAUZE/BANDAGES/DRESSINGS) IMPLANT
DRAPE EXTREMITY T 121X128X90 (DISPOSABLE) ×1 IMPLANT
DRAPE LAPAROTOMY 100X72 PEDS (DRAPES) IMPLANT
DRSG TEGADERM 4X4.75 (GAUZE/BANDAGES/DRESSINGS) IMPLANT
DRSG XEROFORM 1X8 (GAUZE/BANDAGES/DRESSINGS) ×2 IMPLANT
ELECT REM PT RETURN 9FT ADLT (ELECTROSURGICAL) ×2
ELECTRODE REM PT RTRN 9FT ADLT (ELECTROSURGICAL) ×1 IMPLANT
GAUZE 4X4 16PLY ~~LOC~~+RFID DBL (SPONGE) ×2 IMPLANT
GAUZE SPONGE 4X4 12PLY STRL (GAUZE/BANDAGES/DRESSINGS) ×1 IMPLANT
GLOVE SURG ENC MOIS LTX SZ6 (GLOVE) ×2 IMPLANT
GLOVE SURG LTX SZ7 (GLOVE) ×1 IMPLANT
GLOVE SURG UNDER LTX SZ6.5 (GLOVE) ×1 IMPLANT
GLOVE SURG UNDER POLY LF SZ7 (GLOVE) ×2 IMPLANT
GLOVE SURG UNDER POLY LF SZ7.5 (GLOVE) ×1 IMPLANT
GOWN STRL REUS W/ TWL LRG LVL3 (GOWN DISPOSABLE) ×2 IMPLANT
GOWN STRL REUS W/TWL 2XL LVL3 (GOWN DISPOSABLE) ×2 IMPLANT
GOWN STRL REUS W/TWL LRG LVL3 (GOWN DISPOSABLE) ×2
KIT BASIN OR (CUSTOM PROCEDURE TRAY) ×3 IMPLANT
KIT TURNOVER KIT B (KITS) ×3 IMPLANT
MARKER SKIN DUAL TIP RULER LAB (MISCELLANEOUS) ×4 IMPLANT
NDL HYPO 25GX1X1/2 BEV (NEEDLE) ×4 IMPLANT
NEEDLE HYPO 25GX1X1/2 BEV (NEEDLE) ×2 IMPLANT
NS IRRIG 1000ML POUR BTL (IV SOLUTION) ×3 IMPLANT
PACK GENERAL/GYN (CUSTOM PROCEDURE TRAY) ×2 IMPLANT
PACK ORTHO EXTREMITY (CUSTOM PROCEDURE TRAY) ×1 IMPLANT
PAD ARMBOARD 7.5X6 YLW CONV (MISCELLANEOUS) ×4 IMPLANT
PENCIL SMOKE EVACUATOR (MISCELLANEOUS) ×1 IMPLANT
SPECIMEN JAR SMALL (MISCELLANEOUS) ×4 IMPLANT
SPONGE T-LAP 18X18 ~~LOC~~+RFID (SPONGE) ×1 IMPLANT
STOCKINETTE IMPERVIOUS LG (DRAPES) ×2 IMPLANT
STRIP CLOSURE SKIN 1/2X4 (GAUZE/BANDAGES/DRESSINGS) ×2 IMPLANT
SUT ETHILON 2 0 FS 18 (SUTURE) ×2 IMPLANT
SUT ETHILON 2 0 PSLX (SUTURE) ×2 IMPLANT
SUT MNCRL AB 4-0 PS2 18 (SUTURE) ×4 IMPLANT
SUT SILK 2 0 PERMA HAND 18 BK (SUTURE) ×1 IMPLANT
SUT VIC AB 2-0 SH 27 (SUTURE) ×8
SUT VIC AB 2-0 SH 27X BRD (SUTURE) IMPLANT
SUT VIC AB 2-0 SH 27XBRD (SUTURE) ×2 IMPLANT
SUT VIC AB 3-0 SH 27 (SUTURE)
SUT VIC AB 3-0 SH 27X BRD (SUTURE) ×2 IMPLANT
SUT VIC AB 3-0 SH 8-18 (SUTURE) ×1 IMPLANT
SYR CONTROL 10ML LL (SYRINGE) ×3 IMPLANT
TOWEL GREEN STERILE FF (TOWEL DISPOSABLE) ×2 IMPLANT
TUBE CONNECTING 12X1/4 (SUCTIONS) ×1 IMPLANT
YANKAUER SUCT BULB TIP NO VENT (SUCTIONS) ×1 IMPLANT

## 2021-04-16 NOTE — Discharge Instructions (Addendum)
WOUND CARE:  Leave dressings on until Thursday, January 24 unless they are wet.  If they are, it is okay to change earlier.  You may take the dressings off on Thursday, January 24 and then shower.  Warm, soapy water can run over the wounds, but do not scrub.  Pat the wounds dry.  Make sure that they dry completely.  It is okay to leave the wounds open to air if you are not wearing clothing over the top of them.  If so, cover with dry gauze and wrap or tape that into place.  [facility] Phone Number [phone]   POST OP INSTRUCTIONS  Always review your discharge instruction sheet given to you by the facility where your surgery was performed.  IF YOU HAVE DISABILITY OR FAMILY LEAVE FORMS, YOU MUST BRING THEM TO THE OFFICE FOR PROCESSING.  DO NOT GIVE THEM TO YOUR DOCTOR.  A prescription for pain medication may be given to you upon discharge.  Take your pain medication as prescribed, if needed.  If narcotic pain medicine is not needed, then you may take acetaminophen (Tylenol) or ibuprofen (Advil) as needed. Take your usually prescribed medications unless otherwise directed If you need a refill on your pain medication, please contact your pharmacy.  They will contact our office to request authorization.  Prescriptions will not be filled after 5pm or on week-ends. You should eat very light the first 24 hours after surgery, such as soup, crackers, pudding, etc.  Resume your normal diet the day after surgery It is common to experience some constipation if taking pain medication after surgery.  Increasing fluid intake and taking a stool softener will usually help or prevent this problem from occurring.  A mild laxative (Milk of Magnesia or Miralax) should be taken according to package directions if there are no bowel movements after 48 hours. You may shower in 48 hours.  The surgical glue will flake off in 2-3 weeks.   ACTIVITIES:  No strenuous activity or heavy lifting for 1  week.   You may drive when you no longer are taking prescription pain medication, you can comfortably wear a seatbelt, and you can safely maneuver your car and apply brakes. RETURN TO WORK:  __________1-2 weeks.  _______________ Dennis Bast should see your doctor in the office for a follow-up appointment approximately three-four weeks after your surgery.    WHEN TO CALL YOUR DOCTOR: Fever over 101.0 Nausea and/or vomiting. Extreme swelling or bruising. Continued bleeding from incision. Increased pain, redness, or drainage from the incision.  The clinic staff is available to answer your questions during regular business hours.  Please dont hesitate to call and ask to speak to one of the nurses for clinical concerns.  If you have a medical emergency, go to the nearest emergency room or call 911.  A surgeon from [facility] is always on call at the hospital.  For further questions, please visit [email or web address]

## 2021-04-16 NOTE — Anesthesia Procedure Notes (Signed)
Procedure Name: LMA Insertion Date/Time: 04/16/2021 10:12 AM Performed by: Harden Mo, CRNA Pre-anesthesia Checklist: Patient identified, Emergency Drugs available, Suction available and Patient being monitored Patient Re-evaluated:Patient Re-evaluated prior to induction Oxygen Delivery Method: Circle System Utilized Preoxygenation: Pre-oxygenation with 100% oxygen Induction Type: IV induction LMA: LMA inserted LMA Size: 4.0 Number of attempts: 1 Airway Equipment and Method: Bite block Placement Confirmation: positive ETCO2 and breath sounds checked- equal and bilateral Tube secured with: Tape Dental Injury: Teeth and Oropharynx as per pre-operative assessment

## 2021-04-16 NOTE — Op Note (Addendum)
PRE-OPERATIVE DIAGNOSIS: Recurrent squamous cell carcinoma of the bilateral upper extremities (forearms)  POST-OPERATIVE DIAGNOSIS:  Same  PROCEDURE:  Procedure(s): LEFT Upper Extremity Wide local excision 0.5 cm margins of specimen measuring 7x4cm, advancement flap closure for defect 6 cm x 3.5 cm  RIGHT Upper Extremity Wide local excision 0.5 cm margins measuring 4.5x2.5cm, advancement flap closure for defect 7 cm x 4 cm   SURGEON:  Surgeon(s): Stark Klein, MD Radonna Ricker, MD  ASSIST:  NA  ANESTHESIA:   local and general  DRAINS: none   LOCAL MEDICATIONS USED:  MARCAINE and XYLOCAINE   SPECIMENS:   Source of Specimen:   LEFT upper extremity wide local excision SCC,  RIGHT upper extremity wide local excission SCC,  RIGHT upper extremity distal margin,  RIGHT upper extremity proximal margin.   FINDINGS:  Bilateral Upper Extremity Lesions  DISPOSITION OF SPECIMEN:  PATHOLOGY  COUNTS:  YES  PLAN OF CARE: Discharge to home after PACU  PATIENT DISPOSITION:  PACU - hemodynamically stable.    PROCEDURE:   Pt was identified in the holding area, taken to the OR, and placed supine on the OR table.  General anesthesia was induced.  Time out was performed according to the surgical safety checklist.  When all was correct, we continued.    The patient was placed into the supine position.  The Left and Right upper extremities were prepped and draped in sterile fashion.  The SCC lesions were identified and 0.5 cm margins were marked out.  Local was administered under the SCC and the adjacent tissue.  A #10 blade was used to incise the skin around the [facility].  The cautery was used to take the dissection down to the level of the muscle.  The skin was marked in situ with orientation sutures.  The cautery was used to take the specimen off the muscle, and it was passed off the table.    Skin hooks were used to elevate the edges of the incision and the skin was freed up in all directions with  the cautery and blunt dissection to create advancement flaps.  The skin was pulled together to check the tension. . Deep interrupted 2-0 vicryl sutures were placed to relieve tension.  The skin was then reapproximated with 3-0 interrupted vicryl deep dermal sutures and 4-0 monocryl running subcuticular sutures.  2-0 nylon horizontal mattress sutures were placed as well.    The SCC sites were cleaned, dried, and dressed with Benzoin, steristrips, gauze, and tegaderm.    Needle, sponge, and instrument counts were correct.  The patient was awakened from anesthesia and taken to the PACU in stable condition.    Radonna Ricker, MD PGY-5, [facility]  I was personally present during the key and critical portions of this procedure and immediately available throughout the entire procedure, as documented in my operative note.

## 2021-04-16 NOTE — Anesthesia Postprocedure Evaluation (Signed)
Anesthesia Post Note  Patient: Raven Ferguson  Procedure(s) Performed: WIDE LOCAL EXCISION WITH ADVANCEMENT FLAP CLOSURE BILATERAL UPPER EXTREMITY (Bilateral: Arm Lower)     Patient location during evaluation: PACU Anesthesia Type: General Level of consciousness: awake Pain management: pain level controlled Vital Signs Assessment: post-procedure vital signs reviewed and stable Respiratory status: spontaneous breathing Cardiovascular status: stable Anesthetic complications: no   No notable events documented.  Last Vitals:  Vitals:   04/16/21 1240 04/16/21 1255  BP: 130/89 121/77  Pulse: 64 71  Resp: 13 14  Temp:  36.7 C  SpO2: 100% 97%    Last Pain:  Vitals:   04/16/21 1255  TempSrc:   PainSc: 0-No pain                 Huston Foley

## 2021-04-16 NOTE — Interval H&P Note (Deleted)
History and Physical Interval Note:  04/16/2021 9:40 AM  Raven Ferguson  has presented today for surgery, with the diagnosis of SQUAMOUS CELL CARCINOMA.  The various methods of treatment have been discussed with the patient and family. After consideration of risks, benefits and other options for treatment, the patient has consented to  Procedure(s): WIDE LOCAL EXCISION WITH ADVANCEMENT FLAP CLOSURE RIGHT UPPER EXTREMITY (Right) as a surgical intervention.  The patient's history has been reviewed, patient examined, no change in status, stable for surgery.  I have reviewed the patient's chart and labs.  Questions were answered to the patient's satisfaction.     Stark Klein

## 2021-04-16 NOTE — Interval H&P Note (Signed)
History and Physical Interval Note:  04/16/2021 9:41 AM  Raven Ferguson  has presented today for surgery, with the diagnosis of SQUAMOUS CELL CARCINOMA.  The various methods of treatment have been discussed with the patient and family. After consideration of risks, benefits and other options for treatment, the patient has consented to  Procedure(s): WIDE LOCAL EXCISION WITH ADVANCEMENT FLAP CLOSURE Bilateral UPPER EXTREMITY (Right) as a surgical intervention.  The patient's history has been reviewed, patient examined, no change in status, stable for surgery.  I have reviewed the patient's chart and labs.  Questions were answered to the patient's satisfaction.     Stark Klein

## 2021-04-16 NOTE — Transfer of Care (Signed)
Immediate Anesthesia Transfer of Care Note  Patient: Raven Ferguson  Procedure(s) Performed: WIDE LOCAL EXCISION WITH ADVANCEMENT FLAP CLOSURE BILATERAL UPPER EXTREMITY (Bilateral: Arm Lower)  Patient Location: PACU  Anesthesia Type:General  Level of Consciousness: awake, alert  and oriented  Airway & Oxygen Therapy: Patient Spontanous Breathing  Post-op Assessment: Report given to RN, Post -op Vital signs reviewed and stable and Patient moving all extremities X 4  Post vital signs: Reviewed and stable  Last Vitals:  Vitals Value Taken Time  BP 142/106 04/16/21 1226  Temp    Pulse 68 04/16/21 1227  Resp 14 04/16/21 1227  SpO2 99 % 04/16/21 1227  Vitals shown include unvalidated device data.  Last Pain:  Vitals:   04/16/21 0802  TempSrc: Oral         Complications: No notable events documented.

## 2021-04-17 ENCOUNTER — Encounter (HOSPITAL_COMMUNITY): Payer: Self-pay | Admitting: General Surgery

## 2021-04-19 LAB — SURGICAL PATHOLOGY

## 2021-05-02 NOTE — Anesthesia Postprocedure Evaluation (Signed)
Anesthesia Post Note  Patient: Raven Ferguson  Procedure(s) Performed: WIDE LOCAL EXCISION WITH ADVANCEMENT FLAP CLOSURE BILATERAL UPPER EXTREMITY (Bilateral: Arm Lower)     Patient location during evaluation: PACU Anesthesia Type: General Level of consciousness: awake Pain management: pain level controlled Vital Signs Assessment: post-procedure vital signs reviewed and stable Respiratory status: spontaneous breathing Cardiovascular status: stable Postop Assessment: no apparent nausea or vomiting Anesthetic complications: no   No notable events documented.  Last Vitals:  Vitals:   04/16/21 1240 04/16/21 1255  BP: 130/89 121/77  Pulse: 64 71  Resp: 13 14  Temp:  36.7 C  SpO2: 100% 97%    Last Pain:  Vitals:   04/16/21 1255  TempSrc:   PainSc: 0-No pain                 Huston Foley

## 2021-05-09 ENCOUNTER — Ambulatory Visit (INDEPENDENT_AMBULATORY_CARE_PROVIDER_SITE_OTHER): Payer: BC Managed Care – PPO | Admitting: Family Medicine

## 2021-05-09 ENCOUNTER — Other Ambulatory Visit: Payer: Self-pay

## 2021-05-09 ENCOUNTER — Encounter: Payer: Self-pay | Admitting: Family Medicine

## 2021-05-09 VITALS — BP 132/72 | HR 86 | Temp 98.5°F | Wt 216.0 lb

## 2021-05-09 DIAGNOSIS — I10 Essential (primary) hypertension: Secondary | ICD-10-CM

## 2021-05-09 DIAGNOSIS — Z9884 Bariatric surgery status: Secondary | ICD-10-CM

## 2021-05-09 DIAGNOSIS — I4819 Other persistent atrial fibrillation: Secondary | ICD-10-CM | POA: Diagnosis not present

## 2021-05-09 DIAGNOSIS — E039 Hypothyroidism, unspecified: Secondary | ICD-10-CM | POA: Diagnosis not present

## 2021-05-09 DIAGNOSIS — D509 Iron deficiency anemia, unspecified: Secondary | ICD-10-CM

## 2021-05-09 DIAGNOSIS — E669 Obesity, unspecified: Secondary | ICD-10-CM

## 2021-05-09 DIAGNOSIS — F419 Anxiety disorder, unspecified: Secondary | ICD-10-CM

## 2021-05-09 DIAGNOSIS — F17200 Nicotine dependence, unspecified, uncomplicated: Secondary | ICD-10-CM

## 2021-05-09 DIAGNOSIS — E538 Deficiency of other specified B group vitamins: Secondary | ICD-10-CM

## 2021-05-09 MED ORDER — DIAZEPAM 10 MG PO TABS: 10.0000 mg | ORAL_TABLET | Freq: Every evening | ORAL | 1 refills | Status: DC | PRN

## 2021-05-09 MED ORDER — AMLODIPINE BESYLATE 2.5 MG PO TABS: 2.5000 mg | ORAL_TABLET | Freq: Every day | ORAL | 1 refills | Status: DC

## 2021-05-09 MED ORDER — PAROXETINE HCL 20 MG PO TABS: 20.0000 mg | ORAL_TABLET | Freq: Every day | ORAL | 1 refills | Status: DC

## 2021-05-09 MED ORDER — LEVOTHYROXINE SODIUM 137 MCG PO TABS: ORAL_TABLET | ORAL | 0 refills | Status: DC

## 2021-05-09 NOTE — Assessment & Plan Note (Signed)
Stable. °-Continue amlodipine °

## 2021-05-09 NOTE — Assessment & Plan Note (Signed)
Stable.  TSH today.

## 2021-05-09 NOTE — Assessment & Plan Note (Signed)
Labs for further evaluation today.  Will likely need iron infusions if iron stores are low.

## 2021-05-09 NOTE — Patient Instructions (Signed)
Labs today.  I have refilled your medications.  Follow up in 6 months or sooner if needed.  Take care  Dr. Lacinda Axon

## 2021-05-09 NOTE — Assessment & Plan Note (Signed)
Continue Paxil.  Diazepam as directed.

## 2021-05-09 NOTE — Progress Notes (Signed)
Subjective:  Patient ID: Raven Ferguson, female    DOB: Jun 21, 1960  Age: 61 y.o. MRN: [id number]  CC: Chief Complaint  Patient presents with   Establish Care    HPI:  61 year old female with hypertension, A-fib, obesity status post gastric bypass, hypothyroidism, tobacco abuse, anxiety presents to establish care with me.  Has history of iron deficiency anemia.  She has previously taken iron but did not tolerate and also did not absorb very well due to her prior history of gastric bypass.  Needs labs.  Will likely need iron infusions.  Hypertension is well controlled.  She is currently on amlodipine 2.5 mg daily.  Patient has atrial fibrillation.  Followed by cardiology.  CHA2DS2-VASc score of 2.  She is not currently on anticoagulation.  We will discuss this today.  Patient states that her anxiety and associated insomnia is stable on diazepam.  She has been on this for years.  Needs refill.  Patient Active Problem List   Diagnosis Date Noted   Persistent atrial fibrillation ([place]) 05/09/2021   Iron deficiency anemia 04/14/2020   Hidradenitis suppurativa 04/13/2020   Primary hypertension 04/13/2020   Smoker 02/24/2020   Anxiety 01/16/2013   Insomnia 01/16/2013   Hypothyroidism 01/16/2013   GERD (gastroesophageal reflux disease) 10/11/2012    Social Hx   Social History   Socioeconomic History   Marital status: Married    Spouse name: Not on file   Number of children: Not on file   Years of education: Not on file   Highest education level: Not on file  Occupational History   Not on file  Tobacco Use   Smoking status: Every Day    Packs/day: 1.00    Years: 35.00    Pack years: 35.00    Types: Cigarettes    Start date: 04/10/1976   Smokeless tobacco: Never  Vaping Use   Vaping Use: Never used  Substance and Sexual Activity   Alcohol use: No    Alcohol/week: 0.0 standard drinks   Drug use: No   Sexual activity: Not Currently    Birth control/protection:  Post-menopausal  Other Topics Concern   Not on file  Social History Narrative   Not on file   Social Determinants of Health   Financial Resource Strain: Not on file  Food Insecurity: Not on file  Transportation Needs: Not on file  Physical Activity: Not on file  Stress: Not on file  Social Connections: Not on file    Review of Systems  Constitutional: Negative.   Respiratory: Negative.    Cardiovascular: Negative.   Per HPI  Objective:  BP 132/72    Pulse 86    Temp 98.5 F (36.9 C)    Wt 216 lb (98 kg)    SpO2 97%    BMI 35.94 kg/m   BP/Weight 05/09/2021 04/16/2021 07/09/4079  Systolic BP 448 185 631  Diastolic BP 72 77 81  Wt. (Lbs) 216 217 217.3  BMI 35.94 36.11 36.16    Physical Exam Vitals and nursing note reviewed.  Constitutional:      General: She is not in acute distress.    Appearance: Normal appearance. She is not ill-appearing.  HENT:     Head: Normocephalic and atraumatic.  Eyes:     General:        Right eye: No discharge.        Left eye: No discharge.     Conjunctiva/sclera: Conjunctivae normal.  Cardiovascular:     Heart sounds: No  murmur heard.    Comments: Irregular. Pulmonary:     Effort: Pulmonary effort is normal.     Breath sounds: Normal breath sounds. No wheezing, rhonchi or rales.  Neurological:     Mental Status: She is alert.  Psychiatric:        Mood and Affect: Mood normal.        Behavior: Behavior normal.    Lab Results  Component Value Date   WBC 5.2 04/09/2021   HGB 11.7 (L) 04/09/2021   HCT 39.1 04/09/2021   PLT 230 04/09/2021   GLUCOSE 68 (L) 04/09/2021   CHOL 146 02/24/2020   TRIG 67 02/24/2020   HDL 54 02/24/2020   LDLCALC 79 02/24/2020   ALT 9 11/09/2020   AST 16 11/09/2020   NA 139 04/09/2021   K 4.0 04/09/2021   CL 105 04/09/2021   CREATININE 0.68 04/09/2021   BUN 7 04/09/2021   CO2 25 04/09/2021   TSH 6.130 (H) 11/09/2020     Assessment & Plan:   Problem List Items Addressed This Visit        Cardiovascular and Mediastinum   Primary hypertension    Stable.  Continue amlodipine.      Relevant Medications   amLODipine (NORVASC) 2.5 MG tablet   Persistent atrial fibrillation (HCC)    Asymptomatic.  Rate controlled.  Will discuss anticoagulation with cardiology.      Relevant Medications   amLODipine (NORVASC) 2.5 MG tablet     Endocrine   Hypothyroidism    Stable.  TSH today.      Relevant Medications   levothyroxine (SYNTHROID) 137 MCG tablet   Other Relevant Orders   TSH     Other   Smoker    Declines interventions regarding smoking cessation today.      Iron deficiency anemia - Primary    Labs for further evaluation today.  Will likely need iron infusions if iron stores are low.      Relevant Orders   CBC   Iron, TIBC and Ferritin Panel   Anxiety    Continue Paxil.  Diazepam as directed.      Relevant Medications   PARoxetine (PAXIL) 20 MG tablet   diazepam (VALIUM) 10 MG tablet   Other Visit Diagnoses     Obesity (BMI 30-39.9)       Relevant Orders   Hemoglobin A1c   Lipid panel   History of gastric bypass       Relevant Orders   Folate   Vitamin B12   Vitamin D (25 hydroxy)       Meds ordered this encounter  Medications   amLODipine (NORVASC) 2.5 MG tablet    Sig: Take 1 tablet (2.5 mg total) by mouth daily.    Dispense:  90 tablet    Refill:  1   levothyroxine (SYNTHROID) 137 MCG tablet    Sig: TAKE 1 TABLET BY MOUTH DAILY BEFORE BREAKFAST    Dispense:  90 tablet    Refill:  0   PARoxetine (PAXIL) 20 MG tablet    Sig: Take 1 tablet (20 mg total) by mouth daily.    Dispense:  90 tablet    Refill:  1   diazepam (VALIUM) 10 MG tablet    Sig: Take 1 tablet (10 mg total) by mouth at bedtime as needed for anxiety.    Dispense:  30 tablet    Refill:  1    Follow-up:  Return in about 6 months (around 11/06/2021).  [place]

## 2021-05-09 NOTE — Assessment & Plan Note (Signed)
Asymptomatic.  Rate controlled.  Will discuss anticoagulation with cardiology.

## 2021-05-09 NOTE — Assessment & Plan Note (Signed)
Declines interventions regarding smoking cessation today.

## 2021-05-10 LAB — IRON,TIBC AND FERRITIN PANEL
Ferritin: 9 ng/mL — ABNORMAL LOW (ref 15–150)
Iron Saturation: 8 % — CL (ref 15–55)
Iron: 40 ug/dL (ref 27–139)
Total Iron Binding Capacity: 518 ug/dL — ABNORMAL HIGH (ref 250–450)
UIBC: 478 ug/dL — ABNORMAL HIGH (ref 118–369)

## 2021-05-10 LAB — VITAMIN D 25 HYDROXY (VIT D DEFICIENCY, FRACTURES): Vit D, 25-Hydroxy: 24.4 ng/mL — ABNORMAL LOW (ref 30.0–100.0)

## 2021-05-10 LAB — LIPID PANEL
Chol/HDL Ratio: 3.3 ratio (ref 0.0–4.4)
Cholesterol, Total: 167 mg/dL (ref 100–199)
HDL: 50 mg/dL (ref >39)
LDL Chol Calc (NIH): 102 mg/dL — ABNORMAL HIGH (ref 0–99)
Triglycerides: 78 mg/dL (ref 0–149)
VLDL Cholesterol Cal: 15 mg/dL (ref 5–40)

## 2021-05-10 LAB — CBC
Hematocrit: 40 % (ref 34.0–46.6)
Hemoglobin: 12 g/dL (ref 11.1–15.9)
MCH: 23.2 pg — ABNORMAL LOW (ref 26.6–33.0)
MCHC: 30 g/dL — ABNORMAL LOW (ref 31.5–35.7)
MCV: 77 fL — ABNORMAL LOW (ref 79–97)
Platelets: 272 10*3/uL (ref 150–450)
RBC: 5.17 x10E6/uL (ref 3.77–5.28)
RDW: 15.8 % — ABNORMAL HIGH (ref 11.7–15.4)
WBC: 5.9 10*3/uL (ref 3.4–10.8)

## 2021-05-10 LAB — VITAMIN B12: Vitamin B-12: 185 pg/mL — ABNORMAL LOW (ref 232–1245)

## 2021-05-10 LAB — HEMOGLOBIN A1C
Est. average glucose Bld gHb Est-mCnc: 126 mg/dL
Hgb A1c MFr Bld: 6 % — ABNORMAL HIGH (ref 4.8–5.6)

## 2021-05-10 LAB — TSH: TSH: 0.5 u[IU]/mL (ref 0.450–4.500)

## 2021-05-10 LAB — FOLATE: Folate: 15.2 ng/mL (ref >3.0)

## 2021-05-13 NOTE — Addendum Note (Signed)
Addended by: Dairl Ponder on: 05/13/2021 11:16 AM   Modules accepted: Orders

## 2021-05-15 ENCOUNTER — Ambulatory Visit: Payer: 59 | Admitting: Cardiology

## 2021-05-23 ENCOUNTER — Other Ambulatory Visit: Payer: Self-pay | Admitting: Nurse Practitioner

## 2021-05-31 ENCOUNTER — Inpatient Hospital Stay (HOSPITAL_COMMUNITY): Payer: BC Managed Care – PPO

## 2021-05-31 ENCOUNTER — Encounter (HOSPITAL_COMMUNITY): Payer: Self-pay | Admitting: Hematology

## 2021-05-31 ENCOUNTER — Inpatient Hospital Stay (HOSPITAL_COMMUNITY): Payer: BC Managed Care – PPO | Attending: Hematology | Admitting: Hematology

## 2021-05-31 ENCOUNTER — Other Ambulatory Visit: Payer: Self-pay

## 2021-05-31 VITALS — BP 139/91 | HR 70 | Temp 98.0°F | Resp 18 | Ht 65.0 in | Wt 221.6 lb

## 2021-05-31 DIAGNOSIS — Z9884 Bariatric surgery status: Secondary | ICD-10-CM | POA: Insufficient documentation

## 2021-05-31 DIAGNOSIS — Z1231 Encounter for screening mammogram for malignant neoplasm of breast: Secondary | ICD-10-CM

## 2021-05-31 DIAGNOSIS — Z87891 Personal history of nicotine dependence: Secondary | ICD-10-CM | POA: Diagnosis not present

## 2021-05-31 DIAGNOSIS — Z122 Encounter for screening for malignant neoplasm of respiratory organs: Secondary | ICD-10-CM

## 2021-05-31 DIAGNOSIS — E538 Deficiency of other specified B group vitamins: Secondary | ICD-10-CM | POA: Insufficient documentation

## 2021-05-31 DIAGNOSIS — Z79899 Other long term (current) drug therapy: Secondary | ICD-10-CM | POA: Insufficient documentation

## 2021-05-31 DIAGNOSIS — D509 Iron deficiency anemia, unspecified: Secondary | ICD-10-CM

## 2021-05-31 MED ORDER — CYANOCOBALAMIN 1000 MCG/ML IJ SOLN
1000.0000 ug | Freq: Once | INTRAMUSCULAR | Status: AC
  Administered 2021-05-31: 1000 ug via INTRAMUSCULAR
  Filled 2021-05-31: qty 1

## 2021-05-31 NOTE — Progress Notes (Signed)
[facility] [address] [place] [address]   CLINIC:  Medical Oncology/Hematology  Patient Care Team: [name], DO as PCP - General (Family Medicine) Harl Bowie, Alphonse Guild, MD as PCP - Cardiology (Cardiology) Gala Romney Cristopher Estimable, MD as Attending Physician (Gastroenterology) Derek Jack, MD as Medical Oncologist (Hematology)  CHIEF COMPLAINTS/PURPOSE OF CONSULTATION:  Evaluation of IDA  HISTORY OF PRESENTING ILLNESS:  Raven Ferguson 61 y.o. female is here because of evaluation of IDA, at the request of Dr. Lacinda Axon.  Today she reports feeling good. She underwent gastric bypass surgery in 2004 at which time she was placed on iron tablets for 1-2 years before stopping as they irritated her stomach. Prior to surgery she weighed over 300 lbs, and following her surgery she lost about 200 lbs; she has gained back about 50 lbs over the past 3 years. She was previously taking a multivitamin before stopping 1 year ago as her vitamin B-12 levels were remaining low. She is currently taking 1000 units of vitamin D starting 1-2 weeks ago. She denies history of blood transfusions. She does not undergo regular mammograms; her last mammogram was in 2018. She reports severe fatigue. She denies history of CVA and MI.   She has no work history; she is a Printmaker and homemaker. She currently smokes 1 ppd since she was 61 years old. She denies family history of anemia. Her mother passed from vaginal cancer, and 2 maternal aunts had breast cancer.   MEDICAL HISTORY:  Past Medical History:  Diagnosis Date   Anxiety    Cancer ([place])    Complication of anesthesia    Dysrhythmia 11/09/2020   afib   GERD (gastroesophageal reflux disease)    History of kidney stones    Hypertension    Hypothyroidism    Iron deficiency anemia    PONV (postoperative nausea and vomiting)     SURGICAL HISTORY: Past Surgical History:  Procedure Laterality Date   ABDOMINAL ADVANCEMENT FLAP  Bilateral 04/16/2021   Procedure: WIDE LOCAL EXCISION WITH ADVANCEMENT FLAP CLOSURE BILATERAL UPPER EXTREMITY;  Surgeon: Stark Klein, MD;  Location: [facility];  Service: General;  Laterality: Bilateral;   CESAREAN SECTION     CHOLECYSTECTOMY     COLONOSCOPY WITH ESOPHAGOGASTRODUODENOSCOPY (EGD) N/A 12/02/2012   UEA:[id number] post bariatric surgery. Abnormal anastomosis of doubtful clinical significance-status post biopsy (benign)/Colonic diverticulosis. Single colonic polyp-removed. Bx benign. Next TCS 11/2022.   GASTRIC BYPASS  03/24/2002   skin reduction     with hernia repair   TONSILLECTOMY      SOCIAL HISTORY: Social History   Socioeconomic History   Marital status: Married    Spouse name: Not on file   Number of children: Not on file   Years of education: Not on file   Highest education level: Not on file  Occupational History   Not on file  Tobacco Use   Smoking status: Every Day    Packs/day: 1.00    Years: 35.00    Pack years: 35.00    Types: Cigarettes    Start date: 04/10/1976   Smokeless tobacco: Never  Vaping Use   Vaping Use: Never used  Substance and Sexual Activity   Alcohol use: No    Alcohol/week: 0.0 standard drinks   Drug use: No   Sexual activity: Not Currently    Birth control/protection: Post-menopausal  Other Topics Concern   Not on file  Social History Narrative   Not on file   Social Determinants  of Health   Financial Resource Strain: Not on file  Food Insecurity: Not on file  Transportation Needs: Not on file  Physical Activity: Not on file  Stress: Not on file  Social Connections: Not on file  Intimate Partner Violence: Not on file    FAMILY HISTORY: Family History  Problem Relation Age of Onset   Hyperlipidemia Father    Cancer Maternal Aunt        breast   Diabetes Paternal Grandmother    Heart attack Paternal Grandmother    Colon cancer Neg Hx     ALLERGIES:  has No Known Allergies.  MEDICATIONS:  Current Outpatient  Medications  Medication Sig Dispense Refill   albuterol (VENTOLIN HFA) 108 (90 Base) MCG/ACT inhaler Inhale 2 puffs into the lungs every 6 (six) hours as needed for wheezing or shortness of breath. 8 g 0   amLODipine (NORVASC) 2.5 MG tablet Take 1 tablet (2.5 mg total) by mouth daily. 90 tablet 1   diazepam (VALIUM) 10 MG tablet Take 1 tablet (10 mg total) by mouth at bedtime as needed for anxiety. 30 tablet 1   levothyroxine (SYNTHROID) 137 MCG tablet TAKE 1 TABLET BY MOUTH DAILY BEFORE BREAKFAST 90 tablet 0   pantoprazole (PROTONIX) 40 MG tablet Take 1 tablet (40 mg total) by mouth daily. (Patient taking differently: Take 40 mg by mouth daily as needed (acid reflux).) 90 tablet 1   PARoxetine (PAXIL) 20 MG tablet Take 1 tablet (20 mg total) by mouth daily. 90 tablet 1   No current facility-administered medications for this visit.   Facility-Administered Medications Ordered in Other Visits  Medication Dose Route Frequency Provider Last Rate Last Admin   cyanocobalamin ((VITAMIN B-12)) injection 1,000 mcg  1,000 mcg Intramuscular Once Derek Jack, MD        REVIEW OF SYSTEMS:   Review of Systems  Constitutional:  Positive for fatigue. Negative for appetite change.  Respiratory:  Positive for cough and shortness of breath.   Psychiatric/Behavioral:  Positive for depression.   All other systems reviewed and are negative.   PHYSICAL EXAMINATION: ECOG PERFORMANCE STATUS: 1 - Symptomatic but completely ambulatory  Vitals:   05/31/21 1034  BP: (!) 139/91  Pulse: 70  Resp: 18  Temp: 98 F (36.7 C)  SpO2: 97%   Filed Weights   05/31/21 1034  Weight: 221 lb 9.6 oz (100.5 kg)   Physical Exam Vitals reviewed.  Constitutional:      Appearance: Normal appearance. She is obese.  Cardiovascular:     Rate and Rhythm: Normal rate and regular rhythm.     Pulses: Normal pulses.     Heart sounds: Normal heart sounds.  Pulmonary:     Effort: Pulmonary effort is normal.      Breath sounds: Normal breath sounds.  Abdominal:     General: A surgical scar is present.     Palpations: Abdomen is soft. There is no hepatomegaly, splenomegaly or mass.     Tenderness: There is no abdominal tenderness.     Hernia: A hernia is present. Hernia is present in the ventral area (midline).  Musculoskeletal:     Right lower leg: No edema.     Left lower leg: No edema.  Lymphadenopathy:     Cervical: No cervical adenopathy.     Right cervical: No superficial cervical adenopathy.    Left cervical: No superficial cervical adenopathy.     Upper Body:     Right upper body: No supraclavicular or axillary adenopathy.  Left upper body: No supraclavicular or axillary adenopathy.     Lower Body: No right inguinal adenopathy. No left inguinal adenopathy.  Neurological:     General: No focal deficit present.     Mental Status: She is alert and oriented to person, place, and time.  Psychiatric:        Mood and Affect: Mood normal.        Behavior: Behavior normal.     LABORATORY DATA:  I have reviewed the data as listed Recent Results (from the past 2160 hour(s))  CBC     Status: Abnormal   Collection Time: 04/09/21  4:00 PM  Result Value Ref Range   WBC 5.2 4.0 - 10.5 K/uL   RBC 4.91 3.87 - 5.11 MIL/uL   Hemoglobin 11.7 (L) 12.0 - 15.0 g/dL   HCT 39.1 36.0 - 46.0 %   MCV 79.6 (L) 80.0 - 100.0 fL   MCH 23.8 (L) 26.0 - 34.0 pg   MCHC 29.9 (L) 30.0 - 36.0 g/dL   RDW 16.0 (H) 11.5 - 15.5 %   Platelets 230 150 - 400 K/uL   nRBC 0.0 0.0 - 0.2 %    Comment: Performed at [facility], [address], [place], [place] [address]  Basic metabolic panel     Status: Abnormal   Collection Time: 04/09/21  4:00 PM  Result Value Ref Range   Sodium 139 135 - 145 mmol/L   Potassium 4.0 3.5 - 5.1 mmol/L   Chloride 105 98 - 111 mmol/L   CO2 25 22 - 32 mmol/L   Glucose, Bld 68 (L) 70 - 99 mg/dL    Comment: Glucose reference range applies only to samples taken after fasting for at  least 8 hours.   BUN 7 6 - 20 mg/dL   Creatinine, Ser 0.68 0.44 - 1.00 mg/dL   Calcium 9.1 8.9 - 10.3 mg/dL   GFR, Estimated >60 >60 mL/min    Comment: (NOTE) Calculated using the CKD-EPI Creatinine Equation (2021)    Anion gap 9 5 - 15    Comment: Performed at [facility] [address], [place], [place] [address]  Surgical pathology     Status: None   Collection Time: 04/16/21 11:00 AM  Result Value Ref Range   SURGICAL PATHOLOGY      SURGICAL PATHOLOGY CASE: [phone] PATIENT: [facility] Surgical Pathology Report     Clinical History: squamous cell carcinoma (cm)     FINAL MICROSCOPIC DIAGNOSIS:  A. SKIN, LEFT FOREARM, EXCISION: - Well-differentiated squamous cell carcinoma. - Background scar tissue. - All surgical margins negative for tumor.  B. SKIN, RIGHT FOREARM, EXCISION: - Well-differentiated squamous cell carcinoma. - Background scar tissue. - All surgical margins negative for tumor.  C. SKIN, RIGHT FOREARM ADDITIONAL PALMAR MARGIN, EXCISION: - Benign skin and subcutaneous tissue with solar elastosis. - Negative for malignancy.  D. SKIN, RIGHT FOREARM ADDITIONAL DORSAL MARGIN, EXCISION: - Benign skin and subcutaneous tissue with solar elastosis and actinic keratosis. - Negative for malignancy.   GROSS DESCRIPTION:  A. Received fresh and subsequently placed in formalin labeled with the patient's name and DOB is a 6.7 x 3.5 cm light tan skin ellipse excised to a maxi mum depth of 0.7 cm with a long stitch on one long axis marking the palmar surface a short stitch on one tip marking the distal position. On the surface is a 3.7 x 2.6 cm white-tan, indurated, centrally scabrous lesion that comes to within 0.2  cm of the closest peripheral margin. The specimen is inked as follows: palmar edge - blue, opposite edge - red, deep - black. Sectioning from distal to proximal reveals the lesion extends up to 0.4 cm deep, remaining 0.6 cm from the  deep margin. No additional masses or lesions are grossly identified. The specimen is entirely submitted as follows: A1: distal tip, perpendicular A2-A13: sequential midportion (deepest invasion in A6) A14: proximal tip, perpendicular  B. Received fresh and subsequently placed in formalin labeled with the patient's name and DOB is a 4.7 x 2.5 cm light tan skin ellipse excised to a maximum depth of 0.9 cm with a long stitch at one tip marking the palmar position and a short stitch on one long axis mark ing the distal edge. On the surface is a 3.3 x 1.7 cm white-tan, indurated, scabrous lesion located 0.2 cm from the closest peripheral margin. The specimen is inked as follows: distal edge - green, proximal edge - orange, and deep - black. Sectioning from the palmar tip to the opposite tip reveals the lesion invades up to 0.3 cm deep, remaining at least 0.5 cm from the deep margin. No additional masses or lesions are grossly identified. The specimen is entirely submitted as follows: B1: palmar tip, perpendicular B2-B4: sequential midportion (deepest invasion in B3) B5: opposite tip, perpendicular  C. Received fresh and subsequently placed in formalin labeled with the patient's name and "Right forearm... additional palmar margin" is a triangular piece of light tan skin with a stitch at one tip marking the distal position and a long stitch on one edge marking the palmar edge. Each edge is inked orange, yellow, or blue and the deep margin is inked black (please reference  diagram). The specimen is entirely submitted as follows: C1: distal tip, perpendicular C2: midportion C3: palmar edge, perpendicular  D. Received fresh and subsequently placed in formalin labeled with the patient's name and "Right forearm... additional dorsal margin" is a triangular piece of light tan skin with a short stitch on one edge marking the distal edge (inked red) and a long stitch on the opposite edge  marking the palmar edge (inked blue). The third edge is inked orange and the deep margin is inked black (please reference diagram). The specimen is entirely submitted as follows: D1: tip, perpendicular D2: midportion D3: orange-inked edge, perpendicular  (LEF 04/17/2021)   Final Diagnosis performed by Allena Napoleon, MD.   Electronically signed 04/19/2021 Technical component performed at [facility], [place] [address], [place], [place] [address].  Professional component performed at [facility] [address] [place], [place] [address]  Immunohistochemistry Technical component (if applicable) was performed at [facility] [address] [place], [place], [place] [address].  IMMUNOHISTOCHEMISTRY DISCLAIMER (if applicable): Some of these immunohistochemical stains may have been developed and the performance characteristics determine by [facility]. Some may not have been cleared or approved by the U.S. Food and Drug Administration. The FDA has determined that such clearance or approval is not necessary. This test is used for clinical purposes. It should not be regarded as investigational or for research. This laboratory is certified under the [place] (CLIA-88) as qualified to perform high complexity clinical laboratory testing.  The controls stained appropriately.   CBC     Status: Abnormal   Collection Time: 05/09/21 11:04 AM  Result Value Ref Range   WBC 5.9 3.4 - 10.8 x10E3/uL   RBC 5.17  3.77 - 5.28 x10E6/uL   Hemoglobin 12.0 11.1 - 15.9 g/dL   Hematocrit 40.0 34.0 - 46.6 %   MCV 77 (L) 79 - 97 fL   MCH 23.2 (L) 26.6 - 33.0 pg   MCHC 30.0 (L) 31.5 - 35.7 g/dL   RDW 15.8 (H) 11.7 - 15.4 %   Platelets 272 150 - 450 x10E3/uL  Hemoglobin A1c     Status: Abnormal   Collection Time: 05/09/21 11:04 AM  Result Value Ref Range   Hgb A1c MFr Bld 6.0 (H) 4.8 - 5.6 %     Comment:          Prediabetes: 5.7 - 6.4          Diabetes: >6.4          Glycemic control for adults with diabetes: <7.0    Est. average glucose Bld gHb Est-mCnc 126 mg/dL  Iron, TIBC and Ferritin Panel     Status: Abnormal   Collection Time: 05/09/21 11:04 AM  Result Value Ref Range   Total Iron Binding Capacity 518 (H) 250 - 450 ug/dL   UIBC 478 (H) 118 - 369 ug/dL   Iron 40 27 - 139 ug/dL   Iron Saturation 8 (LL) 15 - 55 %   Ferritin 9 (L) 15 - 150 ng/mL  Lipid panel     Status: Abnormal   Collection Time: 05/09/21 11:04 AM  Result Value Ref Range   Cholesterol, Total 167 100 - 199 mg/dL   Triglycerides 78 0 - 149 mg/dL   HDL 50 >39 mg/dL   VLDL Cholesterol Cal 15 5 - 40 mg/dL   LDL Chol Calc (NIH) 102 (H) 0 - 99 mg/dL   Chol/HDL Ratio 3.3 0.0 - 4.4 ratio    Comment:                                   T. Chol/HDL Ratio                                             Men  Women                               1/2 Avg.Risk  3.4    3.3                                   Avg.Risk  5.0    4.4                                2X Avg.Risk  9.6    7.1                                3X Avg.Risk 23.4   11.0   TSH     Status: None   Collection Time: 05/09/21 11:04 AM  Result Value Ref Range   TSH 0.500 0.450 - 4.500 uIU/mL  Folate     Status: None   Collection Time: 05/09/21 11:04 AM  Result Value Ref Range   Folate 15.2 >3.0 ng/mL  Comment: A serum folate concentration of less than 3.1 ng/mL is considered to represent clinical deficiency.   Vitamin B12     Status: Abnormal   Collection Time: 05/09/21 11:04 AM  Result Value Ref Range   Vitamin B-12 185 (L) 232 - 1,245 pg/mL  Vitamin D (25 hydroxy)     Status: Abnormal   Collection Time: 05/09/21 11:04 AM  Result Value Ref Range   Vit D, 25-Hydroxy 24.4 (L) 30.0 - 100.0 ng/mL    Comment: Vitamin D deficiency has been defined by the [place] practice guideline as a level of serum 25-OH  vitamin D less than 20 ng/mL (1,2). The Endocrine Society went on to further define vitamin D insufficiency as a level between 21 and 29 ng/mL (2). 1. IOM (Institute of Medicine). 2010. Dietary reference    intakes for calcium and D. [place]: The    [employer]. 2. Holick MF, Binkley Oceano, Bischoff-Ferrari HA, et al.    Evaluation, treatment, and prevention of vitamin D    deficiency: an Endocrine Society clinical practice    guideline. JCEM. 2011 Jul; 96(7):1911-30.     RADIOGRAPHIC STUDIES: I have personally reviewed the radiological images as listed and agreed with the findings in the report. No results found.  ASSESSMENT:  Iron and B12 deficiency: - History of gastric bypass in 2004 (weight decreased from 385 to 170lb and increased to 215 after 2019) - She has low ferritin level since 2021.  Took iron pills for 1 to 2 years.  Also took multivitamin until a year ago. - No prior history of blood transfusion. - Colonoscopy on 12/02/2012. - Complains of feeling tired.   Social/family history: - Lives at home with her husband. 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 - Smokes 1 pack/day, started at age 1. - Mother had vulvar cancer in 2019. - 2 maternal aunts had breast cancer.   PLAN:  Iron and B12 deficiency from gastric bypass surgery: - Reviewed labs from 05/09/2021 with hemoglobin 12, ferritin 9, percent saturation 8.  Folic acid was normal.  B12 was low at 185. - We have talked about parenteral iron therapy in the form of Venofer x3 for total dose of 1 g. - We have also talked about vitamin B12 injection monthly. - RTC 2 months with repeat CBC, ferritin, iron panel and B12.  2.  Vitamin D deficiency: - Vitamin D was 24 on 05/09/2021. - She started taking vitamin D 2000 units daily about 2 to 3 weeks ago. - We will check vitamin D in 2 months.  3.  Health maintenance: - She did not have any mammogram after 2018.  We will schedule for  screening mammogram.  4.  Smoking history: - She qualifies for a low-dose CT scan of the chest for cancer screening.  We discussed the pros and cons.  She agrees.   All questions were answered. The patient knows to call the clinic with any problems, questions or concerns.  Derek Jack, MD 05/31/21 11:14 AM  [place] [phone]   I, Thana Ates, am acting as a scribe for Dr. Derek Jack.  I, Derek Jack MD, have reviewed the above documentation for accuracy and completeness, and I agree with the above.

## 2021-05-31 NOTE — Patient Instructions (Addendum)
Raven Ferguson at [facility] ?Discharge Instructions ? ?You were seen and examined today by Dr. Delton Coombes. Dr. Delton Coombes is a hematologist, meaning that he specializes in blood abnormalities. Dr. Delton Coombes discussed your past medical history, family history of cancers/blood conditions and the events that led to you being here today. ? ?You were referred to Dr. Delton Coombes due to anemia (low blood).  ? ?Dr. Delton Coombes has recommended IV iron infusions. These will be given here in the clinic over 3 doses. Each dose will take approximately 3 hours to infuse. ? ?Dr. Delton Coombes will follow-up with you in about 2 months to recheck your levels. ? ? ?Thank you for choosing [facility] to provide your oncology and hematology care.  To afford each patient quality time with our provider, please arrive at least 15 minutes before your scheduled appointment time.  ? ?If you have a lab appointment with the [facility] please come in thru the Main Entrance and check in at the main information desk. ? ?You need to re-schedule your appointment should you arrive 10 or more minutes late.  We strive to give you quality time with our providers, and arriving late affects you and other patients whose appointments are after yours.  Also, if you no show three or more times for appointments you may be dismissed from the clinic at the providers discretion.     ?Again, thank you for choosing [facility].  Our hope is that these requests will decrease the amount of time that you wait before being seen by our physicians.       ?_____________________________________________________________ ? ?Should you have questions after your visit to [facility], please contact our office at [phone] and follow the prompts.  Our office hours are 8:00 a.m. and 4:30 p.m. Monday - Friday.  Please note that voicemails left after 4:00 p.m. may not be returned until the  following business day.  We are closed weekends and major holidays.  You do have access to a nurse 24-7, just call the main number to the clinic [phone] and do not press any options, hold on the line and a nurse will answer the phone.   ? ?For prescription refill requests, have your pharmacy contact our office and allow 72 hours.   ? ?Due to Covid, you will need to wear a mask upon entering the hospital. If you do not have a mask, a mask will be given to you at the Main Entrance upon arrival. For doctor visits, patients may have 1 support person age 21 or older with them. For treatment visits, patients can not have anyone with them due to social distancing guidelines and our immunocompromised population.  ? ? ? ?

## 2021-05-31 NOTE — Progress Notes (Signed)
Raven Ferguson presents today for injection per the provider's orders.  Vitamin B12 administration without incident; injection site WNL; see MAR for injection details.  Patient tolerated procedure well and without incident.  Patient remained stable throughout visit. No questions or complaints noted at this time. Patient discharged ambulatory and in stable condition. ? ?

## 2021-06-10 ENCOUNTER — Ambulatory Visit (HOSPITAL_COMMUNITY)
Admission: RE | Admit: 2021-06-10 | Discharge: 2021-06-10 | Disposition: A | Discharge status: Home / Self Care | Payer: BC Managed Care – PPO | Source: Ambulatory Visit | Attending: Hematology | Admitting: Hematology

## 2021-06-10 ENCOUNTER — Encounter (HOSPITAL_COMMUNITY): Payer: Self-pay

## 2021-06-10 ENCOUNTER — Other Ambulatory Visit: Payer: Self-pay

## 2021-06-10 ENCOUNTER — Inpatient Hospital Stay (HOSPITAL_COMMUNITY): Payer: BC Managed Care – PPO

## 2021-06-10 VITALS — BP 142/83 | HR 68 | Temp 98.7°F | Resp 18

## 2021-06-10 DIAGNOSIS — Z1231 Encounter for screening mammogram for malignant neoplasm of breast: Secondary | ICD-10-CM | POA: Insufficient documentation

## 2021-06-10 DIAGNOSIS — D509 Iron deficiency anemia, unspecified: Secondary | ICD-10-CM

## 2021-06-10 MED ORDER — ACETAMINOPHEN 325 MG PO TABS
650.0000 mg | ORAL_TABLET | Freq: Once | ORAL | Status: AC
  Administered 2021-06-10: 650 mg via ORAL
  Filled 2021-06-10: qty 2

## 2021-06-10 MED ORDER — LORATADINE 10 MG PO TABS
10.0000 mg | ORAL_TABLET | Freq: Once | ORAL | Status: AC
  Administered 2021-06-10: 10 mg via ORAL
  Filled 2021-06-10: qty 1

## 2021-06-10 MED ORDER — SODIUM CHLORIDE 0.9 % IV SOLN: Freq: Once | INTRAVENOUS | Status: AC

## 2021-06-10 MED ORDER — SODIUM CHLORIDE 0.9 % IV SOLN
300.0000 mg | Freq: Once | INTRAVENOUS | Status: AC
  Administered 2021-06-10: 300 mg via INTRAVENOUS
  Filled 2021-06-10: qty 300

## 2021-06-10 NOTE — Progress Notes (Signed)
Venofer information given with all questions asked and answered.   ? ?Patient tolerated iron infusion with no complaints voiced.  Peripheral IV site clean and dry with good blood return noted before and after infusion.  Band aid applied.  VSS with discharge and left in satisfactory condition with no s/s of distress noted.   ?

## 2021-06-10 NOTE — Patient Instructions (Signed)

## 2021-06-17 ENCOUNTER — Other Ambulatory Visit: Payer: Self-pay

## 2021-06-17 ENCOUNTER — Inpatient Hospital Stay (HOSPITAL_COMMUNITY): Payer: BC Managed Care – PPO

## 2021-06-17 VITALS — BP 142/78 | HR 75 | Temp 98.9°F | Resp 20

## 2021-06-17 DIAGNOSIS — D509 Iron deficiency anemia, unspecified: Secondary | ICD-10-CM | POA: Diagnosis not present

## 2021-06-17 MED ORDER — SODIUM CHLORIDE 0.9 % IV SOLN
300.0000 mg | Freq: Once | INTRAVENOUS | Status: AC
  Administered 2021-06-17: 300 mg via INTRAVENOUS
  Filled 2021-06-17: qty 300

## 2021-06-17 MED ORDER — SODIUM CHLORIDE 0.9 % IV SOLN: Freq: Once | INTRAVENOUS | Status: AC

## 2021-06-17 NOTE — Patient Instructions (Signed)
[facility]  Discharge Instructions: Thank you for choosing [facility] to provide your oncology and hematology care.  If you have a lab appointment with the Cancer Center, please come in thru the Main Entrance and check in at the main information desk.  Wear comfortable clothing and clothing appropriate for easy access to any Portacath or PICC line.   We strive to give you quality time with your provider. You may need to reschedule your appointment if you arrive late (15 or more minutes).  Arriving late affects you and other patients whose appointments are after yours.  Also, if you miss three or more appointments without notifying the office, you may be dismissed from the clinic at the provider's discretion.      For prescription refill requests, have your pharmacy contact our office and allow 72 hours for refills to be completed.    Today you received the following chemotherapy and/or immunotherapy agents Venofer      To help prevent nausea and vomiting after your treatment, we encourage you to take your nausea medication as directed.  BELOW ARE SYMPTOMS THAT SHOULD BE REPORTED IMMEDIATELY: *FEVER GREATER THAN 100.4 F (38 C) OR HIGHER *CHILLS OR SWEATING *NAUSEA AND VOMITING THAT IS NOT CONTROLLED WITH YOUR NAUSEA MEDICATION *UNUSUAL SHORTNESS OF BREATH *UNUSUAL BRUISING OR BLEEDING *URINARY PROBLEMS (pain or burning when urinating, or frequent urination) *BOWEL PROBLEMS (unusual diarrhea, constipation, pain near the anus) TENDERNESS IN MOUTH AND THROAT WITH OR WITHOUT PRESENCE OF ULCERS (sore throat, sores in mouth, or a toothache) UNUSUAL RASH, SWELLING OR PAIN  UNUSUAL VAGINAL DISCHARGE OR ITCHING   Items with * indicate a potential emergency and should be followed up as soon as possible or go to the Emergency Department if any problems should occur.  Please show the CHEMOTHERAPY ALERT CARD or IMMUNOTHERAPY ALERT CARD at check-in to the Emergency  Department and triage nurse.  Should you have questions after your visit or need to cancel or reschedule your appointment, please contact [facility] [phone]  and follow the prompts.  Office hours are 8:00 a.m. to 4:30 p.m. Monday - Friday. Please note that voicemails left after 4:00 p.m. may not be returned until the following business day.  We are closed weekends and major holidays. You have access to a nurse at all times for urgent questions. Please call the main number to the clinic [phone] and follow the prompts.  For any non-urgent questions, you may also contact your provider using MyChart. We now offer e-Visits for anyone 18 and older to request care online for non-urgent symptoms. For details visit [email or web address].   Also download the MyChart app! Go to the app store, search "MyChart", open the app, select , and log in with your MyChart username and password.  Due to Covid, a mask is required upon entering the hospital/clinic. If you do not have a mask, one will be given to you upon arrival. For doctor visits, patients may have 1 support person aged 18 or older with them. For treatment visits, patients cannot have anyone with them due to current Covid guidelines and our immunocompromised population.  

## 2021-06-17 NOTE — Progress Notes (Signed)
Patient presents today for Venofer per providers order.  Vital signs WNL.  Patient took premedications at home.   ? ?Peripheral IV started and blood return noted pre and post infusion. ? ?Venofer infusion given today per MD orders.  Stable during infusion without adverse affects.  Vital signs stable.  No complaints at this time.  Discharge from clinic ambulatory in stable condition.  Alert and oriented X 3.  Follow up with [facility] as scheduled.  ?

## 2021-06-24 ENCOUNTER — Encounter (HOSPITAL_COMMUNITY): Payer: Self-pay

## 2021-06-24 ENCOUNTER — Inpatient Hospital Stay (HOSPITAL_COMMUNITY): Payer: BC Managed Care – PPO | Attending: Hematology

## 2021-06-24 VITALS — BP 134/94 | HR 59 | Temp 98.7°F | Resp 18

## 2021-06-24 DIAGNOSIS — E538 Deficiency of other specified B group vitamins: Secondary | ICD-10-CM | POA: Diagnosis present

## 2021-06-24 DIAGNOSIS — D509 Iron deficiency anemia, unspecified: Secondary | ICD-10-CM | POA: Insufficient documentation

## 2021-06-24 MED ORDER — SODIUM CHLORIDE 0.9 % IV SOLN: Freq: Once | INTRAVENOUS | Status: AC

## 2021-06-24 MED ORDER — SODIUM CHLORIDE 0.9 % IV SOLN
400.0000 mg | Freq: Once | INTRAVENOUS | Status: AC
  Administered 2021-06-24: 400 mg via INTRAVENOUS
  Filled 2021-06-24: qty 20

## 2021-06-24 NOTE — Addendum Note (Signed)
Addended by: Renda Rolls A on: 06/24/2021 12:14 PM ? ? Modules accepted: Orders ? ?

## 2021-06-24 NOTE — Progress Notes (Signed)
Patient presents today for iron infusion, patient reports taking pre-medications this morning. Pharmacy aware. Patient tolerated iron infusion with no complaints voiced. Peripheral IV site clean and dry with good blood return noted before and after infusion. Band aid applied. VSS with discharge and left in satisfactory condition with no s/s of distress noted.   ?

## 2021-07-05 ENCOUNTER — Telehealth: Payer: Self-pay | Admitting: Family Medicine

## 2021-07-05 NOTE — Telephone Encounter (Signed)
Please advise. Thank you

## 2021-07-05 NOTE — Telephone Encounter (Signed)
Patient would like to get her B 12 shots here at the office instead of [facility]. She has had her infusions as you requested but wants to get shots here. Please advise ?

## 2021-07-08 ENCOUNTER — Ambulatory Visit: Payer: BC Managed Care – PPO | Admitting: Cardiology

## 2021-07-08 ENCOUNTER — Encounter: Payer: Self-pay | Admitting: Cardiology

## 2021-07-08 VITALS — BP 110/74 | HR 75 | Ht 65.0 in | Wt 216.8 lb

## 2021-07-08 DIAGNOSIS — I4819 Other persistent atrial fibrillation: Secondary | ICD-10-CM | POA: Diagnosis not present

## 2021-07-08 NOTE — Patient Instructions (Signed)
Medication Instructions:  Continue all current medications.   Labwork: none  Testing/Procedures: none  Follow-Up: 6 months   Any Other Special Instructions Will Be Listed Below (If Applicable).   If you need a refill on your cardiac medications before your next appointment, please call your pharmacy.  

## 2021-07-08 NOTE — Progress Notes (Signed)
? ? ? ?Clinical Summary ?Raven Ferguson is a 61 y.o.female ? ?1.Afib ?- new diagnosis by pcp 10/2020 ?-CHADS2Vasc score is 2 (HTN, gender).  ?- denies any palpitations,no SOB or lightheadedness ?- she has been self rate controlled not requiring anticoagulation ?  ?  ?2. Leg pain/Thrombophlebitis ?- right left pain and swelling medial to popliteal fossa ?- US showed superficial thrombophlebitis ?- symptoms and palpable cord have resolved ?  ?3. HTN ?- she is on norvasc ? ?4. Anemia ?- followed by hemoatlogy ?Past Medical History:  ?Diagnosis Date  ? Anxiety   ? Cancer [facility])   ? Complication of anesthesia   ? Dysrhythmia 11/09/2020  ? afib  ? GERD (gastroesophageal reflux disease)   ? History of kidney stones   ? Hypertension   ? Hypothyroidism   ? Iron deficiency anemia   ? PONV (postoperative nausea and vomiting)   ? ? ? ?No Known Allergies ? ? ?Current Outpatient Medications  ?Medication Sig Dispense Refill  ? albuterol (VENTOLIN HFA) 108 (90 Base) MCG/ACT inhaler Inhale 2 puffs into the lungs every 6 (six) hours as needed for wheezing or shortness of breath. 8 g 0  ? amLODipine (NORVASC) 2.5 MG tablet Take 1 tablet (2.5 mg total) by mouth daily. 90 tablet 1  ? diazepam (VALIUM) 10 MG tablet Take 1 tablet (10 mg total) by mouth at bedtime as needed for anxiety. 30 tablet 1  ? levothyroxine (SYNTHROID) 137 MCG tablet TAKE 1 TABLET BY MOUTH DAILY BEFORE BREAKFAST 90 tablet 0  ? pantoprazole (PROTONIX) 40 MG tablet Take 1 tablet (40 mg total) by mouth daily. (Patient taking differently: Take 40 mg by mouth daily as needed (acid reflux).) 90 tablet 1  ? PARoxetine (PAXIL) 20 MG tablet Take 1 tablet (20 mg total) by mouth daily. 90 tablet 1  ? ?No current facility-administered medications for this visit.  ? ? ? ?Past Surgical History:  ?Procedure Laterality Date  ? ABDOMINAL ADVANCEMENT FLAP Bilateral 04/16/2021  ? Procedure: WIDE LOCAL EXCISION WITH ADVANCEMENT FLAP CLOSURE BILATERAL UPPER EXTREMITY;  Surgeon: Stark Klein,  MD;  Location: [facility];  Service: General;  Laterality: Bilateral;  ? CESAREAN SECTION    ? CHOLECYSTECTOMY    ? COLONOSCOPY WITH ESOPHAGOGASTRODUODENOSCOPY (EGD) N/A 12/02/2012  ? [id number]:[id number] post bariatric surgery. Abnormal anastomosis of doubtful clinical significance-status post biopsy (benign)/Colonic diverticulosis. Single colonic polyp-removed. Bx benign. Next TCS 11/2022.  ? GASTRIC BYPASS  03/24/2002  ? skin reduction    ? with hernia repair  ? TONSILLECTOMY    ? ? ? ?No Known Allergies ? ? ? ?Family History  ?Problem Relation Age of Onset  ? Hyperlipidemia Father   ? Cancer Maternal Aunt   ?     breast  ? Diabetes Paternal Grandmother   ? Heart attack Paternal Grandmother   ? Colon cancer Neg Hx   ? ? ? ?Social History ?Ms. Renier reports that she has been smoking cigarettes. She started smoking about 45 years ago. She has a 35.00 pack-year smoking history. She has never used smokeless tobacco. ?Ms. Chiem reports no history of alcohol use. ? ? ?Review of Systems ?CONSTITUTIONAL: No weight loss, fever, chills, weakness or fatigue.  ?HEENT: Eyes: No visual loss, blurred vision, double vision or yellow sclerae.No hearing loss, sneezing, congestion, runny nose or sore throat.  ?SKIN: No rash or itching.  ?CARDIOVASCULAR: per hpi ?RESPIRATORY: No shortness of breath, cough or sputum.  ?GASTROINTESTINAL: No anorexia, nausea, vomiting or diarrhea. No abdominal pain or blood.  ?  GENITOURINARY: No burning on urination, no polyuria ?NEUROLOGICAL: No headache, dizziness, syncope, paralysis, ataxia, numbness or tingling in the extremities. No change in bowel or bladder control.  ?MUSCULOSKELETAL: No muscle, back pain, joint pain or stiffness.  ?LYMPHATICS: No enlarged nodes. No history of splenectomy.  ?PSYCHIATRIC: No history of depression or anxiety.  ?ENDOCRINOLOGIC: No reports of sweating, cold or heat intolerance. No polyuria or polydipsia.  ?. ? ? ?Physical Examination ?Today's Vitals  ? 07/08/21 1123  ?BP: 110/74   ?Pulse: 75  ?SpO2: 97%  ?Weight: 216 lb 12.8 oz (98.3 kg)  ?Height: '5\' 5"'$  (1.651 m)  ? ?Body mass index is 36.08 kg/m?. ? ?Gen: resting comfortably, no acute distress ?HEENT: no scleral icterus, pupils equal round and reactive, no palptable cervical adenopathy,  ?CV: irregular, no m/r/g no jvd ?Resp: Clear to auscultation bilaterally ?GI: abdomen is soft, non-tender, non-distended, normal bowel sounds, no hepatosplenomegaly ?MSK: extremities are warm, no edema.  ?Skin: warm, no rash ?Neuro:  no focal deficits ?Psych: appropriate affect ? ? ?Diagnostic Studies ? ?01/2021 echo ? ?1. Left ventricular ejection fraction, by estimation, is 60 to 65%. The  ?left ventricle has normal function. The left ventricle has no regional  ?wall motion abnormalities. Left ventricular diastolic parameters are  ?indeterminate.  ? 2. Right ventricular systolic function is normal. The right ventricular  ?size is normal. There is normal pulmonary artery systolic pressure.  ? 3. Left atrial size was severely dilated.  ? 4. Right atrial size was mildly dilated.  ? 5. The mitral valve is normal in structure. No evidence of mitral valve  ?regurgitation. No evidence of mitral stenosis.  ? 6. The tricuspid valve is abnormal.  ? 7. The aortic valve has an indeterminant number of cusps. Aortic valve  ?regurgitation is not visualized. No aortic stenosis is present.  ? 8. The inferior vena cava is normal in size with greater than 50%  ?respiratory variability, suggesting right atrial pressure of 3 mmHg.  ? ?12/2020 LE Venous US ?IMPRESSION: ?1. No evidence of right lower extremity deep vein thrombosis. ?2. Superficial thrombophlebitis of the right great saphenous vein as ?well as communicating varicosities centered in the medial knee ?region. ? ? ?Assessment and Plan  ? ?1.Persistent Afib ?-no symptoms, she has been self rate controlled not requiring av nodal agent ?-CHADS2Vasc score is 2. This is only a class IIB incidation for anticoagulation in  women, reasonable to do nothing or anticoag in this setting. At this time she favors not being on anticoagulation. We did discuss if develops additional risk factor then anticoagulation would be a stronger recommendation at that time.  ?- EKG today shows rate controlled afib ?- since she has been asymptomatic with rate controlled afib without medications we have not pursued trial of electrical cardioversion to reestablish SR.  ? ? ? ?Arnoldo Lenis, M.D. ?

## 2021-07-08 NOTE — Telephone Encounter (Signed)
Please contact patient to have her set up nurse visit for B12 shots. Thank you! ?

## 2021-07-09 ENCOUNTER — Encounter (HOSPITAL_COMMUNITY): Payer: Self-pay | Admitting: Hematology

## 2021-07-11 ENCOUNTER — Other Ambulatory Visit (INDEPENDENT_AMBULATORY_CARE_PROVIDER_SITE_OTHER): Payer: BC Managed Care – PPO

## 2021-07-11 DIAGNOSIS — E538 Deficiency of other specified B group vitamins: Secondary | ICD-10-CM

## 2021-07-11 MED ORDER — CYANOCOBALAMIN 1000 MCG/ML IJ SOLN
1000.0000 ug | Freq: Once | INTRAMUSCULAR | Status: AC
  Administered 2021-07-11: 1000 ug via INTRAMUSCULAR

## 2021-07-12 ENCOUNTER — Ambulatory Visit (HOSPITAL_COMMUNITY)
Admission: RE | Admit: 2021-07-12 | Discharge: 2021-07-12 | Disposition: A | Discharge status: Home / Self Care | Payer: BC Managed Care – PPO | Source: Ambulatory Visit | Attending: Hematology | Admitting: Hematology

## 2021-07-12 DIAGNOSIS — Z87891 Personal history of nicotine dependence: Secondary | ICD-10-CM | POA: Diagnosis present

## 2021-07-12 DIAGNOSIS — Z122 Encounter for screening for malignant neoplasm of respiratory organs: Secondary | ICD-10-CM | POA: Insufficient documentation

## 2021-07-29 ENCOUNTER — Encounter: Payer: Self-pay | Admitting: Hematology

## 2021-07-29 ENCOUNTER — Other Ambulatory Visit (HOSPITAL_COMMUNITY): Payer: BC Managed Care – PPO

## 2021-08-04 NOTE — Progress Notes (Signed)
? ?[facility] ?[address] ?[place], [place] [address] ? ? ?CLINIC:  ?Medical Oncology/Hematology ? ?PCP:  ?[name], DO ?[place] [id number] ?[phone] ? ? ?REASON FOR VISIT:  ?Follow-up for iron deficiency in the setting of prior gastric bypass surgery ? ?CURRENT THERAPY: Intermittent IV iron and monthly B12 injections ? ?INTERVAL HISTORY:  ?Raven Ferguson 61 y.o. female returns for routine follow-up of iron deficiency in the setting of prior gastric bypass surgery.  She was seen for initial consultation by Dr. Delton Coombes on 05/31/2021 and received IV iron with Venofer 1000 mg in divided doses from 06/10/2021 through 06/24/2021. ? ?She reports improved energy after her IV iron, but is starting to lag again.  She believes that her monthly B12 injections via her PCP have also been helping.  She denies any source of blood loss such as epistaxis, hematemesis, hematochezia, or melena.  She reports mild residual fatigue, with energy about 80%, not yet back to her normal baseline.  She denies any pica, restless legs, headaches, chest pain, dyspnea on exertion, lightheadedness, or syncope. ? ?She has 80% energy and 100% appetite. She endorses that she is maintaining a stable weight. ? ? ?REVIEW OF SYSTEMS:  ?Review of Systems  ?Constitutional:  Positive for fatigue. Negative for appetite change, chills, diaphoresis, fever and unexpected weight change.  ?HENT:   Negative for lump/mass and nosebleeds.   ?Eyes:  Negative for eye problems.  ?Respiratory:  Positive for cough and shortness of breath. Negative for hemoptysis.   ?Cardiovascular:  Negative for chest pain, leg swelling and palpitations.  ?Gastrointestinal:  Negative for abdominal pain, blood in stool, constipation, diarrhea, nausea and vomiting.  ?Genitourinary:  Negative for hematuria.   ?Skin: Negative.   ?Neurological:  Negative for dizziness, headaches and light-headedness.  ?Hematological:  Does not bruise/bleed easily.    ? ? ?PAST MEDICAL/SURGICAL HISTORY:  ?Past Medical History:  ?Diagnosis Date  ? Anxiety   ? Cancer [facility])   ? Complication of anesthesia   ? Dysrhythmia 11/09/2020  ? afib  ? GERD (gastroesophageal reflux disease)   ? History of kidney stones   ? Hypertension   ? Hypothyroidism   ? Iron deficiency anemia   ? PONV (postoperative nausea and vomiting)   ? ?Past Surgical History:  ?Procedure Laterality Date  ? ABDOMINAL ADVANCEMENT FLAP Bilateral 04/16/2021  ? Procedure: WIDE LOCAL EXCISION WITH ADVANCEMENT FLAP CLOSURE BILATERAL UPPER EXTREMITY;  Surgeon: Stark Klein, MD;  Location: [place];  Service: General;  Laterality: Bilateral;  ? CESAREAN SECTION    ? CHOLECYSTECTOMY    ? COLONOSCOPY WITH ESOPHAGOGASTRODUODENOSCOPY (EGD) N/A 12/02/2012  ? [id number] post bariatric surgery. Abnormal anastomosis of doubtful clinical significance-status post biopsy (benign)/Colonic diverticulosis. Single colonic polyp-removed. Bx benign. Next TCS 11/2022.  ? GASTRIC BYPASS  03/24/2002  ? skin reduction    ? with hernia repair  ? TONSILLECTOMY    ? ? ? ?SOCIAL HISTORY:  ?Social History  ? ?Socioeconomic History  ? Marital status: Married  ?  Spouse name: Not on file  ? Number of children: Not on file  ? Years of education: Not on file  ? Highest education level: Not on file  ?Occupational History  ? Not on file  ?Tobacco Use  ? Smoking status: Every Day  ?  Packs/day: 1.00  ?  Years: 35.00  ?  Pack years: 35.00  ?  Types: Cigarettes  ?  Start date: 04/10/1976  ? Smokeless tobacco: Never  ?  Vaping Use  ? Vaping Use: Never used  ?Substance and Sexual Activity  ? Alcohol use: No  ?  Alcohol/week: 0.0 standard drinks  ? Drug use: No  ? Sexual activity: Not Currently  ?  Birth control/protection: Post-menopausal  ?Other Topics Concern  ? Not on file  ?Social History Narrative  ? Not on file  ? ?Social Determinants of Health  ? ?Financial Resource Strain: Not on file  ?Food Insecurity: Not on file  ?Transportation Needs: Not on file   ?Physical Activity: Not on file  ?Stress: Not on file  ?Social Connections: Not on file  ?Intimate Partner Violence: Not on file  ? ? ?FAMILY HISTORY:  ?Family History  ?Problem Relation Age of Onset  ? Hyperlipidemia Father   ? Cancer Maternal Aunt   ?     breast  ? Diabetes Paternal Grandmother   ? Heart attack Paternal Grandmother   ? Colon cancer Neg Hx   ? ? ?CURRENT MEDICATIONS:  ?Outpatient Encounter Medications as of 08/05/2021  ?Medication Sig  ? albuterol (VENTOLIN HFA) 108 (90 Base) MCG/ACT inhaler Inhale 2 puffs into the lungs every 6 (six) hours as needed for wheezing or shortness of breath.  ? amLODipine (NORVASC) 2.5 MG tablet Take 1 tablet (2.5 mg total) by mouth daily.  ? Cyanocobalamin (B-12 IJ) Inject 1,000 mcg as directed every 30 (thirty) days.  ? diazepam (VALIUM) 10 MG tablet Take 1 tablet (10 mg total) by mouth at bedtime as needed for anxiety.  ? levothyroxine (SYNTHROID) 137 MCG tablet TAKE 1 TABLET BY MOUTH DAILY BEFORE BREAKFAST  ? pantoprazole (PROTONIX) 40 MG tablet Take 1 tablet (40 mg total) by mouth daily. (Patient taking differently: Take 40 mg by mouth daily as needed (acid reflux).)  ? PARoxetine (PAXIL) 20 MG tablet Take 1 tablet (20 mg total) by mouth daily.  ? ?No facility-administered encounter medications on file as of 08/05/2021.  ? ? ?ALLERGIES:  ?No Known Allergies ? ? ?PHYSICAL EXAM:  ?ECOG PERFORMANCE STATUS: 1 - Symptomatic but completely ambulatory ? ?There were no vitals filed for this visit. ?There were no vitals filed for this visit. ?Physical Exam ?Constitutional:   ?   Appearance: Normal appearance. She is obese.  ?HENT:  ?   Head: Normocephalic and atraumatic.  ?   Mouth/Throat:  ?   Mouth: Mucous membranes are moist.  ?Eyes:  ?   Extraocular Movements: Extraocular movements intact.  ?   Pupils: Pupils are equal, round, and reactive to light.  ?Cardiovascular:  ?   Rate and Rhythm: Normal rate and regular rhythm.  ?   Pulses: Normal pulses.  ?   Heart sounds:  Normal heart sounds.  ?Pulmonary:  ?   Effort: Pulmonary effort is normal.  ?   Breath sounds: Normal breath sounds.  ?Abdominal:  ?   General: Bowel sounds are normal.  ?   Palpations: Abdomen is soft.  ?   Tenderness: There is no abdominal tenderness.  ?Musculoskeletal:     ?   General: No swelling.  ?   Right lower leg: No edema.  ?   Left lower leg: No edema.  ?Lymphadenopathy:  ?   Cervical: No cervical adenopathy.  ?Skin: ?   General: Skin is warm and dry.  ?Neurological:  ?   General: No focal deficit present.  ?   Mental Status: She is alert and oriented to person, place, and time.  ?Psychiatric:     ?   Mood and Affect: Mood  normal.     ?   Behavior: Behavior normal.  ? ? ? ?LABORATORY DATA:  ?I have reviewed the labs as listed.  ?CBC ?   ?Component Value Date/Time  ? WBC 5.9 05/09/2021 1104  ? WBC 5.2 04/09/2021 1600  ? RBC 5.17 05/09/2021 1104  ? RBC 4.91 04/09/2021 1600  ? HGB 12.0 05/09/2021 1104  ? HCT 40.0 05/09/2021 1104  ? PLT 272 05/09/2021 1104  ? MCV 77 (L) 05/09/2021 1104  ? MCH 23.2 (L) 05/09/2021 1104  ? MCH 23.8 (L) 04/09/2021 1600  ? MCHC 30.0 (L) 05/09/2021 1104  ? MCHC 29.9 (L) 04/09/2021 1600  ? RDW 15.8 (H) 05/09/2021 1104  ? LYMPHSABS 2.6 11/09/2020 1125  ? EOSABS 0.0 11/09/2020 1125  ? BASOSABS 0.1 11/09/2020 1125  ? ? ?  Latest Ref Rng & Units 04/09/2021  ?  4:00 PM 11/09/2020  ? 11:25 AM 02/24/2020  ?  1:42 PM  ?CMP  ?Glucose 70 - 99 mg/dL 68   98   85    ?BUN 6 - 20 mg/dL '7   10   7    '$ ?Creatinine 0.44 - 1.00 mg/dL 0.68   0.74   0.73    ?Sodium 135 - 145 mmol/L 139   141   142    ?Potassium 3.5 - 5.1 mmol/L 4.0   4.4   5.0    ?Chloride 98 - 111 mmol/L 105   101   104    ?CO2 22 - 32 mmol/L '25   24   26    '$ ?Calcium 8.9 - 10.3 mg/dL 9.1   9.4   9.1    ?Total Protein 6.0 - 8.5 g/dL  7.5   7.4    ?Total Bilirubin 0.0 - 1.2 mg/dL  0.4   0.3    ?Alkaline Phos 44 - 121 IU/L  116   118    ?AST 0 - 40 IU/L  16   24    ?ALT 0 - 32 IU/L  9   16    ? ? ?DIAGNOSTIC IMAGING:  ?I have independently  reviewed the relevant imaging and discussed with the patient. ? ?ASSESSMENT & PLAN: ?1.  Iron and B12 deficiency: ?- History of gastric bypass in 2004 (weight decreased from 385 to 170lb and increased to 215 after 2019)

## 2021-08-05 ENCOUNTER — Inpatient Hospital Stay (HOSPITAL_COMMUNITY): Payer: BC Managed Care – PPO | Attending: Hematology | Admitting: Physician Assistant

## 2021-08-05 VITALS — BP 139/111 | HR 73 | Temp 98.1°F | Resp 16 | Ht 65.0 in | Wt 214.9 lb

## 2021-08-05 DIAGNOSIS — R911 Solitary pulmonary nodule: Secondary | ICD-10-CM

## 2021-08-05 DIAGNOSIS — E538 Deficiency of other specified B group vitamins: Secondary | ICD-10-CM | POA: Insufficient documentation

## 2021-08-05 DIAGNOSIS — Z87891 Personal history of nicotine dependence: Secondary | ICD-10-CM

## 2021-08-05 DIAGNOSIS — D509 Iron deficiency anemia, unspecified: Secondary | ICD-10-CM | POA: Insufficient documentation

## 2021-08-05 DIAGNOSIS — Z79899 Other long term (current) drug therapy: Secondary | ICD-10-CM | POA: Diagnosis not present

## 2021-08-05 DIAGNOSIS — Z9884 Bariatric surgery status: Secondary | ICD-10-CM | POA: Diagnosis not present

## 2021-08-05 NOTE — Patient Instructions (Signed)
[place] at [facility] ?Discharge Instructions ? ?You were seen today by Tarri Abernethy PA-C for your vitamin and mineral deficiencies.  Your vitamin and mineral levels are low because your body has a harder time absorbing them after your gastric bypass surgery. ? ?Your iron levels are improved after IV iron, but are not yet at the level we would like to see them it.  Therefore, we will schedule you for additional IV iron (Venofer 300 mg x 2 doses). ? ?Continue your monthly B12 injections at your primary care doctor's office. ? ?Continue taking vitamin D 2000 units (50 mcg) daily.  This is available over-the-counter. ? ?Your CT scan of your chest showed several lung nodules.  These are most likely benign and reactive from your history of smoking.  However, we will check another CT scan of your lungs in the 6 months to make sure that your nodules are not growing or showing any signs of lung cancer. ? ?FOLLOW-UP APPOINTMENT: Repeat labs and office visit in 6 months. ? ? ?Thank you for choosing [place] at [facility] to provide your oncology and hematology care.  To afford each patient quality time with our provider, please arrive at least 15 minutes before your scheduled appointment time.  ? ?If you have a lab appointment with the [facility] please come in thru the Main Entrance and check in at the main information desk. ? ?You need to re-schedule your appointment should you arrive 10 or more minutes late.  We strive to give you quality time with our providers, and arriving late affects you and other patients whose appointments are after yours.  Also, if you no show three or more times for appointments you may be dismissed from the clinic at the providers discretion.     ?Again, thank you for choosing [facility].  Our hope is that these requests will decrease the amount of time that you wait before being seen by our physicians.        ?_____________________________________________________________ ? ?Should you have questions after your visit to [facility], please contact our office at [phone] and follow the prompts.  Our office hours are 8:00 a.m. and 4:30 p.m. Monday - Friday.  Please note that voicemails left after 4:00 p.m. may not be returned until the following business day.  We are closed weekends and major holidays.  You do have access to a nurse 24-7, just call the main number to the clinic [phone] and do not press any options, hold on the line and a nurse will answer the phone.   ? ?For prescription refill requests, have your pharmacy contact our office and allow 72 hours.   ? ?Due to Covid, you will need to wear a mask upon entering the hospital. If you do not have a mask, a mask will be given to you at the Main Entrance upon arrival. For doctor visits, patients may have 1 support person age 52 or older with them. For treatment visits, patients can not have anyone with them due to social distancing guidelines and our immunocompromised population.  ? ? ? ?

## 2021-08-07 ENCOUNTER — Inpatient Hospital Stay (HOSPITAL_COMMUNITY): Payer: BC Managed Care – PPO

## 2021-08-07 VITALS — BP 125/74 | HR 53 | Temp 96.9°F | Resp 20

## 2021-08-07 DIAGNOSIS — D509 Iron deficiency anemia, unspecified: Secondary | ICD-10-CM

## 2021-08-07 MED ORDER — LORATADINE 10 MG PO TABS: 10.0000 mg | ORAL_TABLET | Freq: Once | ORAL | Status: DC

## 2021-08-07 MED ORDER — SODIUM CHLORIDE 0.9 % IV SOLN: Freq: Once | INTRAVENOUS | Status: AC

## 2021-08-07 MED ORDER — ACETAMINOPHEN 325 MG PO TABS: 650.0000 mg | ORAL_TABLET | Freq: Once | ORAL | Status: DC

## 2021-08-07 MED ORDER — SODIUM CHLORIDE 0.9 % IV SOLN
300.0000 mg | Freq: Once | INTRAVENOUS | Status: AC
  Administered 2021-08-07: 300 mg via INTRAVENOUS
  Filled 2021-08-07: qty 100

## 2021-08-07 NOTE — Patient Instructions (Signed)
[facility]  Discharge Instructions: Thank you for choosing [facility] to provide your oncology and hematology care.  If you have a lab appointment with the Cancer Center, please come in thru the Main Entrance and check in at the main information desk.  Wear comfortable clothing and clothing appropriate for easy access to any Portacath or PICC line.   We strive to give you quality time with your provider. You may need to reschedule your appointment if you arrive late (15 or more minutes).  Arriving late affects you and other patients whose appointments are after yours.  Also, if you miss three or more appointments without notifying the office, you may be dismissed from the clinic at the provider's discretion.      For prescription refill requests, have your pharmacy contact our office and allow 72 hours for refills to be completed.    Today you received the following chemotherapy and/or immunotherapy agents Venofer      To help prevent nausea and vomiting after your treatment, we encourage you to take your nausea medication as directed.  BELOW ARE SYMPTOMS THAT SHOULD BE REPORTED IMMEDIATELY: *FEVER GREATER THAN 100.4 F (38 C) OR HIGHER *CHILLS OR SWEATING *NAUSEA AND VOMITING THAT IS NOT CONTROLLED WITH YOUR NAUSEA MEDICATION *UNUSUAL SHORTNESS OF BREATH *UNUSUAL BRUISING OR BLEEDING *URINARY PROBLEMS (pain or burning when urinating, or frequent urination) *BOWEL PROBLEMS (unusual diarrhea, constipation, pain near the anus) TENDERNESS IN MOUTH AND THROAT WITH OR WITHOUT PRESENCE OF ULCERS (sore throat, sores in mouth, or a toothache) UNUSUAL RASH, SWELLING OR PAIN  UNUSUAL VAGINAL DISCHARGE OR ITCHING   Items with * indicate a potential emergency and should be followed up as soon as possible or go to the Emergency Department if any problems should occur.  Please show the CHEMOTHERAPY ALERT CARD or IMMUNOTHERAPY ALERT CARD at check-in to the Emergency  Department and triage nurse.  Should you have questions after your visit or need to cancel or reschedule your appointment, please contact [facility] [phone]  and follow the prompts.  Office hours are 8:00 a.m. to 4:30 p.m. Monday - Friday. Please note that voicemails left after 4:00 p.m. may not be returned until the following business day.  We are closed weekends and major holidays. You have access to a nurse at all times for urgent questions. Please call the main number to the clinic [phone] and follow the prompts.  For any non-urgent questions, you may also contact your provider using MyChart. We now offer e-Visits for anyone 18 and older to request care online for non-urgent symptoms. For details visit [email or web address].   Also download the MyChart app! Go to the app store, search "MyChart", open the app, select [facility], and log in with your MyChart username and password.  Due to Covid, a mask is required upon entering the hospital/clinic. If you do not have a mask, one will be given to you upon arrival. For doctor visits, patients may have 1 support person aged 18 or older with them. For treatment visits, patients cannot have anyone with them due to current Covid guidelines and our immunocompromised population.  

## 2021-08-07 NOTE — Progress Notes (Signed)
Patient presents today for Venofer infusion per providers order.  Vital signs WNL.  Patient states that she took pre-medications prior to appointment.  Patient has no new complaints at this time. ? ?Peripheral IV started and blood return noted pre and post infusion. ? ?Venofer given today per MD orders.  Tolerated infusion without adverse affects.  Vital signs stable.  No complaints at this time.  Discharge from clinic ambulatory in stable condition.  Alert and oriented X 3.  Follow up with [facility] as scheduled.  ?

## 2021-08-13 ENCOUNTER — Other Ambulatory Visit (INDEPENDENT_AMBULATORY_CARE_PROVIDER_SITE_OTHER): Payer: BC Managed Care – PPO | Admitting: *Deleted

## 2021-08-13 DIAGNOSIS — E538 Deficiency of other specified B group vitamins: Secondary | ICD-10-CM

## 2021-08-13 MED ORDER — CYANOCOBALAMIN 1000 MCG/ML IJ SOLN
1000.0000 ug | Freq: Once | INTRAMUSCULAR | Status: AC
  Administered 2021-08-13: 1000 ug via INTRAMUSCULAR

## 2021-08-15 ENCOUNTER — Other Ambulatory Visit: Payer: Self-pay | Admitting: Family Medicine

## 2021-08-15 ENCOUNTER — Ambulatory Visit (HOSPITAL_COMMUNITY): Payer: BC Managed Care – PPO

## 2021-08-26 ENCOUNTER — Inpatient Hospital Stay (HOSPITAL_COMMUNITY): Payer: BC Managed Care – PPO | Attending: Hematology

## 2021-09-13 ENCOUNTER — Other Ambulatory Visit (INDEPENDENT_AMBULATORY_CARE_PROVIDER_SITE_OTHER): Payer: BC Managed Care – PPO | Admitting: *Deleted

## 2021-09-13 DIAGNOSIS — E538 Deficiency of other specified B group vitamins: Secondary | ICD-10-CM

## 2021-09-13 MED ORDER — CYANOCOBALAMIN 1000 MCG/ML IJ SOLN
1000.0000 ug | Freq: Once | INTRAMUSCULAR | Status: AC
  Administered 2021-09-13: 1000 ug via INTRAMUSCULAR

## 2021-10-15 ENCOUNTER — Ambulatory Visit (INDEPENDENT_AMBULATORY_CARE_PROVIDER_SITE_OTHER): Payer: BC Managed Care – PPO

## 2021-10-15 DIAGNOSIS — E538 Deficiency of other specified B group vitamins: Secondary | ICD-10-CM | POA: Diagnosis not present

## 2021-10-15 MED ORDER — CYANOCOBALAMIN 1000 MCG/ML IJ SOLN
1000.0000 ug | Freq: Once | INTRAMUSCULAR | Status: AC
  Administered 2021-10-15: 1000 ug via INTRAMUSCULAR

## 2021-11-14 ENCOUNTER — Other Ambulatory Visit: Payer: Self-pay | Admitting: Family Medicine

## 2021-11-15 ENCOUNTER — Ambulatory Visit (INDEPENDENT_AMBULATORY_CARE_PROVIDER_SITE_OTHER): Payer: BC Managed Care – PPO | Admitting: *Deleted

## 2021-11-15 DIAGNOSIS — E538 Deficiency of other specified B group vitamins: Secondary | ICD-10-CM

## 2021-11-15 MED ORDER — CYANOCOBALAMIN 1000 MCG/ML IJ SOLN
1000.0000 ug | Freq: Once | INTRAMUSCULAR | Status: AC
  Administered 2021-11-15: 1000 ug via INTRAMUSCULAR

## 2021-12-03 ENCOUNTER — Encounter (HOSPITAL_COMMUNITY): Payer: Self-pay | Admitting: Hematology

## 2021-12-06 ENCOUNTER — Ambulatory Visit (INDEPENDENT_AMBULATORY_CARE_PROVIDER_SITE_OTHER): Payer: Self-pay | Admitting: Family Medicine

## 2021-12-06 DIAGNOSIS — E039 Hypothyroidism, unspecified: Secondary | ICD-10-CM

## 2021-12-06 DIAGNOSIS — E538 Deficiency of other specified B group vitamins: Secondary | ICD-10-CM

## 2021-12-06 DIAGNOSIS — I10 Essential (primary) hypertension: Secondary | ICD-10-CM

## 2021-12-06 DIAGNOSIS — D509 Iron deficiency anemia, unspecified: Secondary | ICD-10-CM

## 2021-12-06 MED ORDER — LEVOTHYROXINE SODIUM 137 MCG PO TABS: 137.0000 ug | ORAL_TABLET | Freq: Every day | ORAL | 3 refills | Status: DC

## 2021-12-06 MED ORDER — CYANOCOBALAMIN 1000 MCG/ML IJ SOLN: 1000.0000 ug | INTRAMUSCULAR | 3 refills | Status: DC

## 2021-12-06 MED ORDER — PAROXETINE HCL 20 MG PO TABS: 20.0000 mg | ORAL_TABLET | Freq: Every day | ORAL | 3 refills | Status: DC

## 2021-12-06 MED ORDER — AMLODIPINE BESYLATE 2.5 MG PO TABS: 2.5000 mg | ORAL_TABLET | Freq: Every day | ORAL | 3 refills | Status: DC

## 2021-12-06 NOTE — Patient Instructions (Signed)
I have refilled your medications.  Please follow-up after your insurance returns so that we can proceed with labs and discuss surgical referral for your hernia.  Take care  Dr. Lacinda Axon

## 2021-12-08 DIAGNOSIS — E538 Deficiency of other specified B group vitamins: Secondary | ICD-10-CM | POA: Insufficient documentation

## 2021-12-08 NOTE — Assessment & Plan Note (Signed)
Will need labs in the near future (once insurance is restarted).

## 2021-12-08 NOTE — Progress Notes (Signed)
Subjective:  Patient ID: Raven Ferguson, female    DOB: [birth year]  Age: 61 y.o. MRN: [id number]  CC: Chief Complaint  Patient presents with   Medication follow up     Hx of gastric bypass- b12 deficiency and iron infusions, currently in between insurances Medication refills     HPI:  61 year old female with a history of gastric bypass, B12 deficiency, iron deficiency anemia, hypothyroidism, hypertension, A-fib presents for follow-up.  Patient states that she is doing very well at this time.  She has recently lost her insurance.  She would like to defer labs if possible.  She was previously receiving B12 injections in our clinic.  We will discuss starting this at home.  Hypertension is well controlled on amlodipine.  Patient remains on Synthroid 137 mcg daily.  A-fib stable.  She is not on anticoagulation.  Patient has a large ventral hernia.  She is deferring surgery this time.  Patient Active Problem List   Diagnosis Date Noted   B12 deficiency 12/08/2021   Persistent atrial fibrillation ([place]) 05/09/2021   Iron deficiency anemia 04/14/2020   Hidradenitis suppurativa 04/13/2020   Primary hypertension 04/13/2020   Smoker 02/24/2020   Anxiety 01/16/2013   Insomnia 01/16/2013   Hypothyroidism 01/16/2013   GERD (gastroesophageal reflux disease) 10/11/2012    Social Hx   Social History   Socioeconomic History   Marital status: Married    Spouse name: Not on file   Number of children: Not on file   Years of education: Not on file   Highest education level: Not on file  Occupational History   Not on file  Tobacco Use   Smoking status: Every Day    Packs/day: 1.00    Years: 35.00    Total pack years: 35.00    Types: Cigarettes    Start date: 04/10/1976   Smokeless tobacco: Never  Vaping Use   Vaping Use: Never used  Substance and Sexual Activity   Alcohol use: No    Alcohol/week: 0.0 standard drinks of alcohol   Drug use: No   Sexual activity: Not Currently     Birth control/protection: Post-menopausal  Other Topics Concern   Not on file  Social History Narrative   Not on file   Social Determinants of Health   Financial Resource Strain: Not on file  Food Insecurity: Not on file  Transportation Needs: Not on file  Physical Activity: Not on file  Stress: Not on file  Social Connections: Not on file    Review of Systems Per HPI  Objective:  BP 126/84   Pulse 76   Temp (!) 97 F (36.1 C)   Ht '5\' 5"'$  (1.651 m)   Wt 209 lb (94.8 kg)   SpO2 97%   BMI 34.78 kg/m      12/06/2021    2:39 PM 08/07/2021   10:21 AM 08/07/2021    8:21 AM  BP/Weight  Systolic BP 384 536 468  Diastolic BP 84 74 70  Wt. (Lbs) 209    BMI 34.78 kg/m2      Physical Exam Vitals and nursing note reviewed.  Constitutional:      General: She is not in acute distress.    Appearance: Normal appearance. She is obese.  HENT:     Head: Normocephalic and atraumatic.  Eyes:     General:        Right eye: No discharge.        Left eye: No discharge.  Conjunctiva/sclera: Conjunctivae normal.  Cardiovascular:     Rate and Rhythm: Rhythm irregularly irregular.  Pulmonary:     Effort: Pulmonary effort is normal.     Breath sounds: Normal breath sounds. No wheezing, rhonchi or rales.  Abdominal:     Comments: Large ventral hernia.  Neurological:     Mental Status: She is alert.  Psychiatric:        Mood and Affect: Mood normal.        Behavior: Behavior normal.     Lab Results  Component Value Date   WBC 5.9 05/09/2021   HGB 12.0 05/09/2021   HCT 40.0 05/09/2021   PLT 272 05/09/2021   GLUCOSE 68 (L) 04/09/2021   CHOL 167 05/09/2021   TRIG 78 05/09/2021   HDL 50 05/09/2021   LDLCALC 102 (H) 05/09/2021   ALT 9 11/09/2020   AST 16 11/09/2020   NA 139 04/09/2021   K 4.0 04/09/2021   CL 105 04/09/2021   CREATININE 0.68 04/09/2021   BUN 7 04/09/2021   CO2 25 04/09/2021   TSH 0.500 05/09/2021   HGBA1C 6.0 (H) 05/09/2021     Assessment &  Plan:   Problem List Items Addressed This Visit       Cardiovascular and Mediastinum   Primary hypertension    Well controlled.  Continue Norvasc.       Relevant Medications   amLODipine (NORVASC) 2.5 MG tablet     Endocrine   Hypothyroidism    Deferring labs due to lack of insurance. Continue Synthroid.      Relevant Medications   levothyroxine (SYNTHROID) 137 MCG tablet     Other   B12 deficiency    Rx sent for B12 to be given monthly at home.       Iron deficiency anemia    Will need labs in the near future (once insurance is restarted).      Relevant Medications   cyanocobalamin (VITAMIN B12) 1000 MCG/ML injection    Meds ordered this encounter  Medications   amLODipine (NORVASC) 2.5 MG tablet    Sig: Take 1 tablet (2.5 mg total) by mouth daily.    Dispense:  90 tablet    Refill:  3   PARoxetine (PAXIL) 20 MG tablet    Sig: Take 1 tablet (20 mg total) by mouth daily.    Dispense:  90 tablet    Refill:  3   levothyroxine (SYNTHROID) 137 MCG tablet    Sig: Take 1 tablet (137 mcg total) by mouth daily.    Dispense:  90 tablet    Refill:  3   cyanocobalamin (VITAMIN B12) 1000 MCG/ML injection    Sig: Inject 1 mL (1,000 mcg total) into the muscle every 30 (thirty) days.    Dispense:  3 mL    Refill:  3    Follow-up:  In the next few months after insurance is reinstated.   Libby

## 2021-12-08 NOTE — Assessment & Plan Note (Signed)
Deferring labs due to lack of insurance. Continue Synthroid.

## 2021-12-08 NOTE — Assessment & Plan Note (Signed)
Rx sent for B12 to be given monthly at home.

## 2021-12-08 NOTE — Assessment & Plan Note (Signed)
Well controlled. Continue Norvasc. 

## 2022-01-10 ENCOUNTER — Ambulatory Visit: Payer: BC Managed Care – PPO | Admitting: Cardiology

## 2022-01-24 ENCOUNTER — Telehealth: Payer: Self-pay | Admitting: Hematology

## 2022-01-27 ENCOUNTER — Encounter (HOSPITAL_COMMUNITY): Payer: Self-pay | Admitting: Hematology

## 2022-01-29 ENCOUNTER — Ambulatory Visit (HOSPITAL_COMMUNITY): Payer: BC Managed Care – PPO

## 2022-01-29 ENCOUNTER — Other Ambulatory Visit: Payer: Self-pay | Admitting: Family Medicine

## 2022-01-31 ENCOUNTER — Encounter (HOSPITAL_COMMUNITY): Payer: Self-pay | Admitting: Hematology

## 2022-02-03 ENCOUNTER — Telehealth: Payer: BC Managed Care – PPO | Admitting: Physician Assistant

## 2022-02-25 ENCOUNTER — Telehealth: Payer: Self-pay

## 2022-02-25 DIAGNOSIS — Z131 Encounter for screening for diabetes mellitus: Secondary | ICD-10-CM

## 2022-02-25 DIAGNOSIS — D509 Iron deficiency anemia, unspecified: Secondary | ICD-10-CM

## 2022-02-25 DIAGNOSIS — Z79899 Other long term (current) drug therapy: Secondary | ICD-10-CM

## 2022-02-25 DIAGNOSIS — I10 Essential (primary) hypertension: Secondary | ICD-10-CM

## 2022-02-25 DIAGNOSIS — E039 Hypothyroidism, unspecified: Secondary | ICD-10-CM

## 2022-02-25 DIAGNOSIS — E538 Deficiency of other specified B group vitamins: Secondary | ICD-10-CM

## 2022-02-25 NOTE — Telephone Encounter (Signed)
Caller name: DORCAS MELITO  On DPR?: Yes  Call back number: [phone] (mobile)  Provider they see: [name], DO  Reason for call:Pt called she is getting insurance January 1st and wants to have blood work ordered regarding her thyroid

## 2022-02-25 NOTE — Telephone Encounter (Signed)
Please advise. Thank you

## 2022-02-26 NOTE — Addendum Note (Signed)
Addended by: Vicente Males on: 02/26/2022 10:41 AM   Modules accepted: Orders

## 2022-02-26 NOTE — Telephone Encounter (Signed)
Lab orders placed. Pt voicemail is full; unable to leave message

## 2022-02-28 NOTE — Telephone Encounter (Signed)
Attempted to contact patient; voicemail is full at this time

## 2022-02-28 NOTE — Telephone Encounter (Signed)
Patient notfied

## 2022-03-26 LAB — HEMOGLOBIN A1C
Est. average glucose Bld gHb Est-mCnc: 117 mg/dL
Hgb A1c MFr Bld: 5.7 % — ABNORMAL HIGH (ref 4.8–5.6)

## 2022-03-26 LAB — LIPID PANEL
Chol/HDL Ratio: 3.5 ratio (ref 0.0–4.4)
Cholesterol, Total: 191 mg/dL (ref 100–199)
HDL: 55 mg/dL (ref >39)
LDL Chol Calc (NIH): 118 mg/dL — ABNORMAL HIGH (ref 0–99)
Triglycerides: 99 mg/dL (ref 0–149)
VLDL Cholesterol Cal: 18 mg/dL (ref 5–40)

## 2022-03-26 LAB — CBC WITH DIFFERENTIAL/PLATELET
Basophils Absolute: 0.1 10*3/uL (ref 0.0–0.2)
Basos: 1 %
EOS (ABSOLUTE): 0 10*3/uL (ref 0.0–0.4)
Eos: 1 %
Hematocrit: 51.1 % — ABNORMAL HIGH (ref 34.0–46.6)
Hemoglobin: 17.3 g/dL — ABNORMAL HIGH (ref 11.1–15.9)
Immature Grans (Abs): 0 10*3/uL (ref 0.0–0.1)
Immature Granulocytes: 0 %
Lymphocytes Absolute: 2.6 10*3/uL (ref 0.7–3.1)
Lymphs: 38 %
MCH: 31.8 pg (ref 26.6–33.0)
MCHC: 33.9 g/dL (ref 31.5–35.7)
MCV: 94 fL (ref 79–97)
Monocytes Absolute: 0.4 10*3/uL (ref 0.1–0.9)
Monocytes: 6 %
Neutrophils Absolute: 3.6 10*3/uL (ref 1.4–7.0)
Neutrophils: 54 %
Platelets: 186 10*3/uL (ref 150–450)
RBC: 5.44 x10E6/uL — ABNORMAL HIGH (ref 3.77–5.28)
RDW: 12.1 % (ref 11.7–15.4)
WBC: 6.7 10*3/uL (ref 3.4–10.8)

## 2022-03-26 LAB — IRON,TIBC AND FERRITIN PANEL
Ferritin: 43 ng/mL (ref 15–150)
Iron Saturation: 34 % (ref 15–55)
Iron: 132 ug/dL (ref 27–139)
Total Iron Binding Capacity: 392 ug/dL (ref 250–450)
UIBC: 260 ug/dL (ref 118–369)

## 2022-03-26 LAB — COMPREHENSIVE METABOLIC PANEL
ALT: 19 IU/L (ref 0–32)
AST: 19 IU/L (ref 0–40)
Albumin/Globulin Ratio: 1.3 (ref 1.2–2.2)
Albumin: 4.3 g/dL (ref 3.9–4.9)
Alkaline Phosphatase: 96 IU/L (ref 44–121)
BUN/Creatinine Ratio: 18 (ref 12–28)
BUN: 14 mg/dL (ref 8–27)
Bilirubin Total: 0.7 mg/dL (ref 0.0–1.2)
CO2: 26 mmol/L (ref 20–29)
Calcium: 9.8 mg/dL (ref 8.7–10.3)
Chloride: 100 mmol/L (ref 96–106)
Creatinine, Ser: 0.79 mg/dL (ref 0.57–1.00)
Globulin, Total: 3.2 g/dL (ref 1.5–4.5)
Glucose: 79 mg/dL (ref 70–99)
Potassium: 4.7 mmol/L (ref 3.5–5.2)
Sodium: 139 mmol/L (ref 134–144)
Total Protein: 7.5 g/dL (ref 6.0–8.5)
eGFR: 85 mL/min/{1.73_m2} (ref >59)

## 2022-03-26 LAB — VITAMIN B12: Vitamin B-12: 467 pg/mL (ref 232–1245)

## 2022-03-26 LAB — VITAMIN D 25 HYDROXY (VIT D DEFICIENCY, FRACTURES): Vit D, 25-Hydroxy: 26.2 ng/mL — ABNORMAL LOW (ref 30.0–100.0)

## 2022-03-26 LAB — TSH: TSH: 12.1 u[IU]/mL — ABNORMAL HIGH (ref 0.450–4.500)

## 2022-03-28 MED ORDER — LEVOTHYROXINE SODIUM 150 MCG PO TABS: 150.0000 ug | ORAL_TABLET | Freq: Every day | ORAL | 1 refills | Status: DC

## 2022-03-28 NOTE — Addendum Note (Signed)
Addended by: Dairl Ponder on: 03/28/2022 11:49 AM   Modules accepted: Orders

## 2022-05-19 ENCOUNTER — Telehealth: Payer: Self-pay | Admitting: Family Medicine

## 2022-05-19 DIAGNOSIS — C4492 Squamous cell carcinoma of skin, unspecified: Secondary | ICD-10-CM

## 2022-05-19 NOTE — Telephone Encounter (Signed)
Patient requesting new referral to dermatologist Dr. Robyne Askew with [facility] . She has been to [facility] several times and has not helped her problem. Dr. Willette Pa number is [phone]

## 2022-05-19 NOTE — Telephone Encounter (Signed)
Referral ordered in EPIC. Patient aware

## 2022-05-19 NOTE — Telephone Encounter (Signed)
[name], DO   Raven Ferguson, please place referral with diagnosis of SCC.  Thank you  Dr. Lacinda Axon

## 2022-05-19 NOTE — Telephone Encounter (Addendum)
Squamous cell carcinoma for over 2 years has had 10- 12 cut off and they still keep coming and wants a new dermatologist to see if other options  [employer]

## 2022-05-22 ENCOUNTER — Encounter: Payer: Self-pay | Admitting: Radiology

## 2022-07-09 ENCOUNTER — Other Ambulatory Visit: Payer: Self-pay | Admitting: Family Medicine

## 2022-11-03 ENCOUNTER — Encounter: Payer: Self-pay | Admitting: *Deleted

## 2022-12-08 ENCOUNTER — Other Ambulatory Visit: Payer: Self-pay | Admitting: Nurse Practitioner

## 2022-12-08 ENCOUNTER — Other Ambulatory Visit: Payer: Self-pay | Admitting: Family Medicine

## 2022-12-10 ENCOUNTER — Encounter: Payer: Self-pay | Admitting: Nurse Practitioner

## 2022-12-26 ENCOUNTER — Other Ambulatory Visit: Payer: Self-pay | Admitting: Family Medicine

## 2022-12-26 DIAGNOSIS — Z1211 Encounter for screening for malignant neoplasm of colon: Secondary | ICD-10-CM

## 2022-12-26 DIAGNOSIS — Z1212 Encounter for screening for malignant neoplasm of rectum: Secondary | ICD-10-CM

## 2023-01-09 ENCOUNTER — Other Ambulatory Visit: Payer: Self-pay | Admitting: General Surgery

## 2023-01-09 ENCOUNTER — Encounter (HOSPITAL_COMMUNITY): Payer: Self-pay | Admitting: Hematology

## 2023-01-09 DIAGNOSIS — R2230 Localized swelling, mass and lump, unspecified upper limb: Secondary | ICD-10-CM

## 2023-01-09 NOTE — Progress Notes (Signed)
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 Donell Beers   Past/Anticipated interventions by patient's surgeon/dermatologist for current problematic lesion, if any:  Dr. 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 Donell Beers 05-06-21 Initial history:  Patient is a 62 year old female who was referred 03/2021. She had issues with multiple squamous cell carcinomas on her upper extremities for several years. Most recently, she had recurrences in the upper forearms. The right side recurred twice and the left side has recurred once. She had wide local excisions by dermatology on both sides with negative margins and despite this had recurrence. She desired reexcision again due to pain.   Interval History:   Patient underwent wide local excision of bilateral forearm squamous cell cancers on 04/16/2021. Margins were negative. She is doing okay. She does not have significant discomfort or swelling. She is having issues with the suture tails rubbing and being irritated.  Pathology 04/16/2021 A. SKIN, LEFT FOREARM, EXCISION:  - Well-differentiated squamous cell carcinoma.  - Background scar tissue.  - All surgical margins negative for tumor.   B. SKIN, RIGHT FOREARM, EXCISION:  - Well-differentiated squamous cell carcinoma.  - Background scar tissue.  - All surgical margins negative for tumor.   C. SKIN, RIGHT FOREARM ADDITIONAL PALMAR  MARGIN, EXCISION:  - Benign skin and subcutaneous tissue with solar elastosis.  - Negative for malignancy.   D. SKIN, RIGHT FOREARM ADDITIONAL DORSAL MARGIN, EXCISION:  - Benign skin and subcutaneous tissue with solar elastosis and actinic  keratosis.  - Negative for malignancy.   Physical Examination:   Nylon sutures are intact bilaterally. These are removed. Steri-Strips and benzoin are placed. There is a little bit of swelling at the distal aspect of the left forearm wound. This appears consistent with a small hematoma. There is no evidence of drainage or erythema.  Assessment and Plan:   Raven Ferguson is a 62 y.o. female who underwent wide local excision of bilateral forearm squamous cell carcinomas on 04/16/2021  Diagnoses and all orders for this visit:  Squamous cell cancer of skin of left forearm  Squamous cell cancer of skin of right forearm  Patient has good negative margins. She sees dermatology frequently. I will follow-up with her on an as-needed basis. I cleared her to start increasing her activity.  Return if symptoms worsen or fail to improve.  The plan was discussed in detail with the patient today, who expressed understanding. The patient has my contact information, and understands to call me with any additional questions or concerns in the interval. I would be happy to see the patient back sooner if the need arises.   Dr. Donell Beers 01-09-23 (new concern as well for axillary and breast as well)    Past skin cancers, if any: yes, see above in note   History of Blistering sunburns, if any: {:18581}  SAFETY ISSUES: Prior radiation? {:18581} Pacemaker/ICD? {:18581} Possible current pregnancy? no Is the patient on methotrexate? no  Current  Complaints / other details:  ***

## 2023-01-12 ENCOUNTER — Other Ambulatory Visit (HOSPITAL_COMMUNITY): Payer: Self-pay | Admitting: General Surgery

## 2023-01-12 ENCOUNTER — Ambulatory Visit
Admission: RE | Admit: 2023-01-12 | Discharge: 2023-01-12 | Disposition: A | Discharge status: Home / Self Care | Payer: Managed Care, Other (non HMO) | Source: Ambulatory Visit | Attending: Radiation Oncology | Admitting: Radiation Oncology

## 2023-01-12 ENCOUNTER — Encounter: Payer: Self-pay | Admitting: Radiation Oncology

## 2023-01-12 ENCOUNTER — Encounter (HOSPITAL_COMMUNITY): Payer: Self-pay | Admitting: General Surgery

## 2023-01-12 VITALS — BP 120/87 | HR 91 | Temp 97.8°F | Resp 18 | Wt 206.4 lb

## 2023-01-12 DIAGNOSIS — I10 Essential (primary) hypertension: Secondary | ICD-10-CM | POA: Insufficient documentation

## 2023-01-12 DIAGNOSIS — E039 Hypothyroidism, unspecified: Secondary | ICD-10-CM | POA: Diagnosis not present

## 2023-01-12 DIAGNOSIS — Z79890 Hormone replacement therapy (postmenopausal): Secondary | ICD-10-CM | POA: Diagnosis not present

## 2023-01-12 DIAGNOSIS — F17210 Nicotine dependence, cigarettes, uncomplicated: Secondary | ICD-10-CM | POA: Insufficient documentation

## 2023-01-12 DIAGNOSIS — C44622 Squamous cell carcinoma of skin of right upper limb, including shoulder: Secondary | ICD-10-CM

## 2023-01-12 DIAGNOSIS — R2230 Localized swelling, mass and lump, unspecified upper limb: Secondary | ICD-10-CM

## 2023-01-12 DIAGNOSIS — K219 Gastro-esophageal reflux disease without esophagitis: Secondary | ICD-10-CM | POA: Diagnosis not present

## 2023-01-12 DIAGNOSIS — Z79899 Other long term (current) drug therapy: Secondary | ICD-10-CM | POA: Insufficient documentation

## 2023-01-12 DIAGNOSIS — Z87442 Personal history of urinary calculi: Secondary | ICD-10-CM | POA: Insufficient documentation

## 2023-01-28 LAB — COLOGUARD: COLOGUARD: NEGATIVE

## 2023-01-29 ENCOUNTER — Ambulatory Visit (HOSPITAL_COMMUNITY)
Admission: RE | Admit: 2023-01-29 | Discharge: 2023-01-29 | Disposition: A | Discharge status: Home / Self Care | Payer: Managed Care, Other (non HMO) | Source: Ambulatory Visit | Attending: General Surgery | Admitting: General Surgery

## 2023-01-29 DIAGNOSIS — R2230 Localized swelling, mass and lump, unspecified upper limb: Secondary | ICD-10-CM

## 2023-02-16 ENCOUNTER — Other Ambulatory Visit: Payer: Self-pay

## 2023-02-16 ENCOUNTER — Encounter (HOSPITAL_BASED_OUTPATIENT_CLINIC_OR_DEPARTMENT_OTHER): Payer: Self-pay | Admitting: General Surgery

## 2023-02-16 NOTE — Progress Notes (Signed)
   02/16/23 1654  PAT Phone Screen  Is the patient taking a GLP-1 receptor agonist? No  Do You Have Diabetes? No  Do You Have Hypertension? Yes  Have You Ever Been to the ER for Asthma? No  Have You Taken Oral Steroids in the Past 3 Months? No  Do you Take Phenteramine or any Other Diet Drugs? No  Recent  Lab Work, EKG, CXR? No (Echo 2022 EF 60-65%)  Do you have a history of heart problems? Kathie Rhodes)  Yes (cardiology 2023,)  Cardiologist Name Dina Rich (pt has maintained cardiac health since 2023 and has not needed to follow up with a cardiologist. Denies any new or worsening cardiac symptoms)  Any Recent Hospitalizations? No  Height 5\' 5"  (1.651 m)  Weight 93.6 kg  Pat Appointment Scheduled (S)  Yes (EKG)

## 2023-02-17 ENCOUNTER — Encounter (HOSPITAL_COMMUNITY): Payer: Self-pay | Admitting: Hematology

## 2023-02-23 ENCOUNTER — Encounter (HOSPITAL_BASED_OUTPATIENT_CLINIC_OR_DEPARTMENT_OTHER)
Admission: RE | Admit: 2023-02-23 | Discharge: 2023-02-23 | Disposition: A | Discharge status: Home / Self Care | Payer: Commercial Managed Care - HMO | Source: Ambulatory Visit | Attending: General Surgery

## 2023-02-23 DIAGNOSIS — I10 Essential (primary) hypertension: Secondary | ICD-10-CM | POA: Diagnosis not present

## 2023-02-23 DIAGNOSIS — Z01810 Encounter for preprocedural cardiovascular examination: Secondary | ICD-10-CM | POA: Diagnosis present

## 2023-02-23 MED ORDER — CHLORHEXIDINE GLUCONATE CLOTH 2 % EX PADS: 6.0000 | MEDICATED_PAD | Freq: Once | CUTANEOUS | Status: DC

## 2023-02-23 NOTE — Progress Notes (Signed)

## 2023-02-25 NOTE — H&P (Signed)
PROVIDER: Matthias Hughs, MD Patient Care Team: Tommie Sams, DO as PCP - General (Family Medicine) Register, Chancy Milroy, [place] (Dermatology) Nita Sells, MD (Dermatology)  MRN: [id number] DOB: 11-24-1960   Chief Complaint: New Consultation ( Squamous cell carcinoma rt forearm)   History of Present Illness: Raven Ferguson is a 62 y.o. female who is seen today for recurrent squamous cell carcinoma.  Initial history:   Patient is a 62 year old female who was referred 03/2021. She had issues with multiple squamous cell carcinomas on her upper extremities for several years. Most recently, she had recurrences in the upper forearms. The right side recurred twice and the left side has recurred once. She had wide local excisions by dermatology on both sides with negative margins and despite this had recurrence. She desired reexcision again due to pain.   Patient underwent wide local excision of bilateral forearm squamous cell cancers on 04/16/2021. Margins were negative.    Pathology 04/16/2021 A. SKIN, LEFT FOREARM, EXCISION:  - Well-differentiated squamous cell carcinoma.  - Background scar tissue.  - All surgical margins negative for tumor.   B. SKIN, RIGHT FOREARM, EXCISION:  - Well-differentiated squamous cell carcinoma.  - Background scar tissue.  - All surgical margins negative for tumor.   C. SKIN, RIGHT FOREARM ADDITIONAL PALMAR MARGIN, EXCISION:  - Benign skin and subcutaneous tissue with solar elastosis.  - Negative for malignancy.   D. SKIN, RIGHT FOREARM ADDITIONAL DORSAL MARGIN, EXCISION:  - Benign skin and subcutaneous tissue with solar elastosis and actinic  keratosis.  - Negative for malignancy.   Interval history:   Since the patient's follow-up, she has developed multiple recurrent squamous cell cancers in the right forearm scar. She said it seemed like they were at the suture sites. She has had multiple excisions of the recurrences and all of them have had  negative margins. Despite this, she continues to develop recurrent squamous cell along the incision and at a site where she had a bandage. She also continues to have soreness in this arm to the point that she is unable to use his arm to do a lot of normal activities.   Review of Systems: A complete review of systems was obtained from the patient. I have reviewed this information and discussed as appropriate with the patient. See HPI as well for other ROS.  Review of Systems  All other systems reviewed and are negative.   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 No pertinent surgical history.   No Known Allergies  Current Outpatient Medications on File Prior to Visit  Medication Sig Dispense Refill  amLODIPine (NORVASC) 2.5 MG tablet  cholecalciferol 1000 unit tablet Take by mouth  cyanocobalamin (VITAMIN B12) 1,000 mcg/mL injection INJECT 1 ML (1000 MCG TOTAL) INTO THE MUSCLE EVERY 30 (THIRTY) DAYS  levothyroxine (SYNTHROID) 137 MCG tablet TAKE 1 TABLET BY MOUTH DAILY BEFORE BREAKFAST  PARoxetine (PAXIL) 20 MG tablet   No current facility-administered medications on file prior to visit.   Family History  Problem Relation Age of Onset  Coronary Artery Disease (Blocked arteries around heart) Father  Diabetes Father    Social History   Tobacco Use  Smoking Status Every Day  Types: Cigarettes  Smokeless Tobacco Never    Social History   Socioeconomic History  Marital status: Married  Tobacco Use  Smoking status: Every Day  Types: Cigarettes  Smokeless tobacco: Never  Substance and Sexual Activity  Alcohol use: Never  Drug use: Never   Objective:   Vitals:  BP: (!)  158/102  Pulse: 54  Temp: 36.4 C (97.5 F)  SpO2: 99%  Weight: 95.8 kg (211 lb 3.2 oz)  Height: 160 cm (5\' 3" )  PainSc: 0-No pain   Body mass index is 37.41 kg/m.  Head: Normocephalic and atraumatic.  Eyes: Conjunctivae are normal. Pupils are equal, round, and reactive to light. No scleral icterus.  Neck: Normal range of motion. Neck supple. No tracheal deviation present. No thyromegaly present. No cervical lymphadenopathy Resp: No respiratory distress, normal effort. Neurological: Alert and oriented to person, place, and time. Coordination normal.  Skin: Skin is warm and dry. No rash noted. No diaphoretic. No erythema. No pallor.  The patient has a obliquely oriented incision on the right forearm. This was not done in a longitudinal fashion because of the way the previous scar was oriented. There are scaling nodular areas at multiple sites along the scar and then 1 slightly proximal to the scar. Left forearm scar is nice and soft and flat and completely normal appearing. The right forearm scar is also tender. Psychiatric: Normal mood and affect. Normal behavior. Judgment and thought content normal.  Lymphatic: There is some right axillary fullness that is not present on the left. I cannot tell if there is a discrete lymph node that is enlarged on this side, but I am concerned.  Labs, Imaging and Diagnostic Testing:  We do have pathology reports sent from Dr. Scharlene Gloss office that show invasive squamous cell carcinoma well-differentiated with edges free x 3 from November 18, 2022  Assessment and Plan:   Diagnoses and all orders for this visit:  Axillary fullness - US breast limited right; Future - Mammo diagnostic breast tomosynthesis bilateral; Future  Recurrent squamous cell carcinoma of skin - Ambulatory Referral to Radiation Oncology  Given that the patient has multiple recurrent cancers in the same surgical area, I would reexcised this with generous margins. I advised the patient  that I think we can close a portion of this but that we might require skin substitute coverage for a portion. I would plan to use myriad matrix to help bridge the defect with myriad morcell powder underneath to promote healing. I also would excise a little bit deeper given this soreness. I discussed that the main complications present are recurrent cancer and wound issues. We are going to wait on surgery until we have the axillary ultrasound to see if any lymph node excision is needed. Certainly these are small areas so I do not think we need to do a sentinel node biopsy at this time, but given how long this has been going on with multiple excisions of recurrent cancers, I do think it is reasonable to make sure there is no abnormal lymphadenopathy in the axilla. I am also ordering a mammogram to go with this as she is out of date on her breast cancer screening.  I am also referring her to radiation oncology given the multiple recurrences in this location. I still think we need to reexcise this region, but after she heals radiation may be helpful.

## 2023-02-26 ENCOUNTER — Encounter (HOSPITAL_BASED_OUTPATIENT_CLINIC_OR_DEPARTMENT_OTHER): Payer: Self-pay | Admitting: General Surgery

## 2023-02-26 ENCOUNTER — Other Ambulatory Visit: Payer: Self-pay

## 2023-02-26 ENCOUNTER — Ambulatory Visit (HOSPITAL_BASED_OUTPATIENT_CLINIC_OR_DEPARTMENT_OTHER): Payer: Commercial Managed Care - HMO | Admitting: Anesthesiology

## 2023-02-26 ENCOUNTER — Encounter (HOSPITAL_BASED_OUTPATIENT_CLINIC_OR_DEPARTMENT_OTHER)
Admission: RE | Disposition: A | Discharge status: Home / Self Care | Payer: Self-pay | Source: Home / Self Care | Attending: General Surgery

## 2023-02-26 ENCOUNTER — Ambulatory Visit (HOSPITAL_BASED_OUTPATIENT_CLINIC_OR_DEPARTMENT_OTHER)
Admission: RE | Admit: 2023-02-26 | Discharge: 2023-02-26 | Disposition: A | Discharge status: Home / Self Care | Payer: Commercial Managed Care - HMO | Attending: General Surgery | Admitting: General Surgery

## 2023-02-26 DIAGNOSIS — F17210 Nicotine dependence, cigarettes, uncomplicated: Secondary | ICD-10-CM | POA: Insufficient documentation

## 2023-02-26 DIAGNOSIS — C44622 Squamous cell carcinoma of skin of right upper limb, including shoulder: Secondary | ICD-10-CM | POA: Diagnosis present

## 2023-02-26 DIAGNOSIS — I4891 Unspecified atrial fibrillation: Secondary | ICD-10-CM | POA: Diagnosis not present

## 2023-02-26 DIAGNOSIS — J449 Chronic obstructive pulmonary disease, unspecified: Secondary | ICD-10-CM | POA: Insufficient documentation

## 2023-02-26 DIAGNOSIS — E039 Hypothyroidism, unspecified: Secondary | ICD-10-CM | POA: Insufficient documentation

## 2023-02-26 DIAGNOSIS — F17200 Nicotine dependence, unspecified, uncomplicated: Secondary | ICD-10-CM

## 2023-02-26 DIAGNOSIS — C44602 Unspecified malignant neoplasm of skin of right upper limb, including shoulder: Secondary | ICD-10-CM

## 2023-02-26 DIAGNOSIS — F419 Anxiety disorder, unspecified: Secondary | ICD-10-CM | POA: Insufficient documentation

## 2023-02-26 DIAGNOSIS — I10 Essential (primary) hypertension: Secondary | ICD-10-CM | POA: Diagnosis not present

## 2023-02-26 SURGERY — EXCISION, MASS, UPPER EXTREMITY
Anesthesia: General | Site: Arm Lower

## 2023-02-26 MED ORDER — LIDOCAINE-EPINEPHRINE (PF) 1 %-1:200000 IJ SOLN
INTRAMUSCULAR | Status: DC | PRN
  Administered 2023-02-26: 16.5 mL

## 2023-02-26 MED ORDER — PROPOFOL 10 MG/ML IV BOLUS
INTRAVENOUS | Status: AC
  Filled 2023-02-26: qty 20

## 2023-02-26 MED ORDER — EPHEDRINE 5 MG/ML INJ
INTRAVENOUS | Status: AC
Start: 2023-02-26 — End: ?
  Filled 2023-02-26: qty 5

## 2023-02-26 MED ORDER — PHENYLEPHRINE 80 MCG/ML (10ML) SYRINGE FOR IV PUSH (FOR BLOOD PRESSURE SUPPORT)
PREFILLED_SYRINGE | INTRAVENOUS | Status: DC | PRN
  Administered 2023-02-26: 80 ug via INTRAVENOUS
  Administered 2023-02-26: 160 ug via INTRAVENOUS
  Administered 2023-02-26: 80 ug via INTRAVENOUS

## 2023-02-26 MED ORDER — MIDAZOLAM HCL 2 MG/2ML IJ SOLN
INTRAMUSCULAR | Status: AC
Start: 2023-02-26 — End: ?
  Filled 2023-02-26: qty 2

## 2023-02-26 MED ORDER — PROPOFOL 10 MG/ML IV BOLUS
INTRAVENOUS | Status: DC | PRN
  Administered 2023-02-26: 200 mg via INTRAVENOUS

## 2023-02-26 MED ORDER — LIDOCAINE 2% (20 MG/ML) 5 ML SYRINGE
INTRAMUSCULAR | Status: DC | PRN
  Administered 2023-02-26: 100 mg via INTRAVENOUS

## 2023-02-26 MED ORDER — LIDOCAINE 2% (20 MG/ML) 5 ML SYRINGE
INTRAMUSCULAR | Status: AC
  Filled 2023-02-26: qty 5

## 2023-02-26 MED ORDER — SCOPOLAMINE 1 MG/3DAYS TD PT72: 1.0000 | MEDICATED_PATCH | TRANSDERMAL | Status: DC

## 2023-02-26 MED ORDER — FENTANYL CITRATE (PF) 100 MCG/2ML IJ SOLN
INTRAMUSCULAR | Status: AC
  Filled 2023-02-26: qty 2

## 2023-02-26 MED ORDER — CEFAZOLIN SODIUM-DEXTROSE 2-4 GM/100ML-% IV SOLN
2.0000 g | INTRAVENOUS | Status: AC
  Administered 2023-02-26: 2 g via INTRAVENOUS

## 2023-02-26 MED ORDER — DEXAMETHASONE SODIUM PHOSPHATE 10 MG/ML IJ SOLN
INTRAMUSCULAR | Status: DC | PRN
  Administered 2023-02-26: 10 mg via INTRAVENOUS

## 2023-02-26 MED ORDER — ONDANSETRON HCL 4 MG/2ML IJ SOLN
INTRAMUSCULAR | Status: AC
  Filled 2023-02-26: qty 4

## 2023-02-26 MED ORDER — DEXAMETHASONE SODIUM PHOSPHATE 10 MG/ML IJ SOLN
INTRAMUSCULAR | Status: AC
  Filled 2023-02-26: qty 2

## 2023-02-26 MED ORDER — LACTATED RINGERS IV SOLN: INTRAVENOUS | Status: DC

## 2023-02-26 MED ORDER — ACETAMINOPHEN 500 MG PO TABS: 1000.0000 mg | ORAL_TABLET | Freq: Once | ORAL | Status: DC

## 2023-02-26 MED ORDER — OXYCODONE HCL 5 MG PO TABS: 5.0000 mg | ORAL_TABLET | Freq: Four times a day (QID) | ORAL | 0 refills | Status: DC | PRN

## 2023-02-26 MED ORDER — SODIUM CHLORIDE 0.9 % IV SOLN: INTRAVENOUS | Status: DC | PRN

## 2023-02-26 MED ORDER — PHENYLEPHRINE 80 MCG/ML (10ML) SYRINGE FOR IV PUSH (FOR BLOOD PRESSURE SUPPORT)
PREFILLED_SYRINGE | INTRAVENOUS | Status: AC
  Filled 2023-02-26: qty 10

## 2023-02-26 MED ORDER — FENTANYL CITRATE (PF) 250 MCG/5ML IJ SOLN
INTRAMUSCULAR | Status: DC | PRN
  Administered 2023-02-26 (×2): 50 ug via INTRAVENOUS

## 2023-02-26 MED ORDER — ONDANSETRON HCL 4 MG/2ML IJ SOLN
INTRAMUSCULAR | Status: DC | PRN
  Administered 2023-02-26: 4 mg via INTRAVENOUS

## 2023-02-26 MED ORDER — CEFAZOLIN SODIUM-DEXTROSE 2-4 GM/100ML-% IV SOLN
INTRAVENOUS | Status: AC
  Filled 2023-02-26: qty 100

## 2023-02-26 MED ORDER — ACETAMINOPHEN 500 MG PO TABS
1000.0000 mg | ORAL_TABLET | ORAL | Status: AC
  Administered 2023-02-26: 1000 mg via ORAL

## 2023-02-26 MED ORDER — ACETAMINOPHEN 500 MG PO TABS
ORAL_TABLET | ORAL | Status: AC
  Filled 2023-02-26: qty 2

## 2023-02-26 SURGICAL SUPPLY — 53 items
BENZOIN TINCTURE PRP APPL 2/3 (GAUZE/BANDAGES/DRESSINGS) IMPLANT
BLADE HEX COATED 2.75 (ELECTRODE) ×2 IMPLANT
BLADE SURG 10 STRL SS (BLADE) ×1 IMPLANT
BLADE SURG 15 STRL LF DISP TIS (BLADE) ×1 IMPLANT
BNDG COHESIVE 3X5 TAN ST LF (GAUZE/BANDAGES/DRESSINGS) IMPLANT
BNDG GAUZE DERMACEA FLUFF 4 (GAUZE/BANDAGES/DRESSINGS) IMPLANT
CANISTER SUCT 1200ML W/VALVE (MISCELLANEOUS) IMPLANT
CHLORAPREP W/TINT 26 (MISCELLANEOUS) ×2 IMPLANT
COVER BACK TABLE 60X90IN (DRAPES) IMPLANT
COVER MAYO STAND STRL (DRAPES) IMPLANT
DERMABOND ADVANCED .7 DNX12 (GAUZE/BANDAGES/DRESSINGS) ×2 IMPLANT
DRAPE EXTREMITY T 121X128X90 (DISPOSABLE) IMPLANT
DRAPE LAPAROSCOPIC ABDOMINAL (DRAPES) IMPLANT
DRAPE LAPAROTOMY 100X72 PEDS (DRAPES) IMPLANT
DRAPE UTILITY XL STRL (DRAPES) ×2 IMPLANT
DRSG CUTIMED SORBACT 7X9 (GAUZE/BANDAGES/DRESSINGS) IMPLANT
ELECT REM PT RETURN 9FT ADLT (ELECTROSURGICAL) ×1
ELECTRODE REM PT RTRN 9FT ADLT (ELECTROSURGICAL) ×2 IMPLANT
GAUZE PAD ABD 8X10 STRL (GAUZE/BANDAGES/DRESSINGS) IMPLANT
GAUZE SPONGE 4X4 12PLY STRL LF (GAUZE/BANDAGES/DRESSINGS) ×1 IMPLANT
GLOVE BIO SURGEON STRL SZ 6 (GLOVE) ×2 IMPLANT
GLOVE BIOGEL PI IND STRL 6.5 (GLOVE) ×1 IMPLANT
GOWN STRL REUS W/ TWL LRG LVL3 (GOWN DISPOSABLE) ×1 IMPLANT
GOWN STRL REUS W/ TWL XL LVL3 (GOWN DISPOSABLE) ×1 IMPLANT
GRAFT MYRIAD 3 LAYER 7X10 (Graft) IMPLANT
NDL HYPO 25X1 1.5 SAFETY (NEEDLE) ×2 IMPLANT
NEEDLE HYPO 25X1 1.5 SAFETY (NEEDLE) ×1
NS IRRIG 1000ML POUR BTL (IV SOLUTION) IMPLANT
PACK BASIN DAY SURGERY FS (CUSTOM PROCEDURE TRAY) ×2 IMPLANT
PACK UNIVERSAL I (CUSTOM PROCEDURE TRAY) IMPLANT
PENCIL SMOKE EVACUATOR (MISCELLANEOUS) ×2 IMPLANT
POWDER MYRIAD MORCLLS FINE 500 (Miscellaneous) IMPLANT
PWDR MYRIAD MORCELLS FINE 500 (Miscellaneous) ×1 IMPLANT
SLEEVE SCD COMPRESS KNEE MED (STOCKING) IMPLANT
SPIKE FLUID TRANSFER (MISCELLANEOUS) IMPLANT
SPONGE T-LAP 18X18 ~~LOC~~+RFID (SPONGE) ×2 IMPLANT
STAPLER SKIN PROX WIDE 3.9 (STAPLE) IMPLANT
STOCKINETTE 4X48 STRL (DRAPES) IMPLANT
STOCKINETTE IMPERVIOUS LG (DRAPES) IMPLANT
STRIP CLOSURE SKIN 1/2X4 (GAUZE/BANDAGES/DRESSINGS) ×2 IMPLANT
SUT ETHILON 2 0 FSLX (SUTURE) IMPLANT
SUT MNCRL AB 4-0 PS2 18 (SUTURE) ×2 IMPLANT
SUT PROLENE 3 0 PS 2 (SUTURE) IMPLANT
SUT SILK 2 0 SH (SUTURE) IMPLANT
SUT SILK 3-0 18XBRD TIE BLK (SUTURE) IMPLANT
SUT VIC AB 2-0 SH 27XBRD (SUTURE) IMPLANT
SUT VIC AB 3-0 SH 27X BRD (SUTURE) ×2 IMPLANT
SUT VICRYL 3-0 CR8 SH (SUTURE) IMPLANT
SYR BULB EAR ULCER 3OZ GRN STR (SYRINGE) ×2 IMPLANT
SYR CONTROL 10ML LL (SYRINGE) ×2 IMPLANT
TOWEL GREEN STERILE FF (TOWEL DISPOSABLE) ×2 IMPLANT
TUBE CONNECTING 20X1/4 (TUBING) IMPLANT
YANKAUER SUCT BULB TIP NO VENT (SUCTIONS) IMPLANT

## 2023-02-26 NOTE — Interval H&P Note (Signed)
History and Physical Interval Note:  02/26/2023 2:15 PM  Raven Ferguson  has presented today for surgery, with the diagnosis of RECURRENT RT FOREARM SQUAMOUS CELL CARCINOMA DEFECT 4X12 RECURRENT RIGHT FOREARM SQUAMOUS CELL CANCER.  The various methods of treatment have been discussed with the patient and family. After consideration of risks, benefits and other options for treatment, the patient has consented to  Procedure(s): WIDE LOCAL EXCISION rt forearm, ADVANCEMENT FLAP closure, POSSIBLE SKIN SUBSTITUTE COVERAGE ARM MASS (N/A) as a surgical intervention.  The patient's history has been reviewed, patient examined, no change in status, stable for surgery.  I have reviewed the patient's chart and labs.  Questions were answered to the patient's satisfaction.     Almond Lint

## 2023-02-26 NOTE — Anesthesia Procedure Notes (Signed)
Procedure Name: LMA Insertion Date/Time: 02/26/2023 2:45 PM  Performed by: Roosvelt Harps, CRNAPre-anesthesia Checklist: Patient identified, Emergency Drugs available, Suction available, Patient being monitored and Timeout performed Patient Re-evaluated:Patient Re-evaluated prior to induction Oxygen Delivery Method: Circle system utilized Preoxygenation: Pre-oxygenation with 100% oxygen Induction Type: IV induction Ventilation: Mask ventilation without difficulty LMA: LMA inserted LMA Size: 4.0 Placement Confirmation: positive ETCO2 and breath sounds checked- equal and bilateral Dental Injury: Teeth and Oropharynx as per pre-operative assessment

## 2023-02-26 NOTE — Discharge Instructions (Addendum)
[facility] Office Phone Number [phone]   POST OP INSTRUCTIONS  Always review your discharge instruction sheet given to you by the facility where your surgery was performed.  IF YOU HAVE DISABILITY OR FAMILY LEAVE FORMS, YOU MUST BRING THEM TO THE OFFICE FOR PROCESSING.  DO NOT GIVE THEM TO YOUR DOCTOR.  Take 2 tylenol (acetominophen) three times a day for 3 days.  If you still have pain, add ibuprofen with food in between if able to take this (if you have kidney issues or stomach issues, do not take ibuprofen).  If both of those are not enough, add the narcotic pain pill.  If you find you are needing a lot of this overnight after surgery, call the next morning for a refill.   Take your usually prescribed medications unless otherwise directed If you need a refill on your pain medication, please contact your pharmacy.  They will contact our office to request authorization.  Prescriptions will not be filled after 5pm or on week-ends. You should eat very light the first 24 hours after surgery, such as soup, crackers, pudding, etc.  Resume your normal diet the day after surgery It is common to experience some constipation if taking pain medication after surgery.  Increasing fluid intake and taking a stool softener will usually help or prevent this problem from occurring.  A mild laxative (Milk of Magnesia or Miralax) should be taken according to package directions if there are no bowel movements after 48 hours. You may shower in 48 hours.  The surgical glue will flake off in 2-3 weeks.   ACTIVITIES:  No strenuous activity or heavy lifting for 2 weeks.  You may drive when you no longer are taking prescription pain medication, you can comfortably wear a seatbelt, and you can safely maneuver your car and apply brakes. RETURN TO WORK:  __________to be determined_______________ Bonita Quin should see your doctor in the office for a follow-up appointment approximately three-four weeks after your  surgery.    WHEN TO CALL YOUR DOCTOR: Fever over 101.0 Nausea and/or vomiting. Extreme swelling or bruising. Continued bleeding from incision. Increased pain, redness, or drainage from the incision.  The clinic staff is available to answer your questions during regular business hours.  Please don't hesitate to call and ask to speak to one of the nurses for clinical concerns.  If you have a medical emergency, go to the nearest emergency room or call 911.  A surgeon from [facility] is always on call at the hospital.  For further questions, please visit [email or web address]   May take Tylenol after 5:30 pm, if needed.    Post Anesthesia Home Care Instructions  Activity: Get plenty of rest for the remainder of the day. A responsible individual must stay with you for 24 hours following the procedure.  For the next 24 hours, DO NOT: -Drive a car -Advertising copywriter -Drink alcoholic beverages -Take any medication unless instructed by your physician -Make any legal decisions or sign important papers.  Meals: Start with liquid foods such as gelatin or soup. Progress to regular foods as tolerated. Avoid greasy, spicy, heavy foods. If nausea and/or vomiting occur, drink only clear liquids until the nausea and/or vomiting subsides. Call your physician if vomiting continues.  Special Instructions/Symptoms: Your throat may feel dry or sore from the anesthesia or the breathing tube placed in your throat during surgery. If this causes discomfort, gargle with warm salt water. The discomfort should disappear within 24 hours.  If you had  a scopolamine patch placed behind your ear for the management of post- operative nausea and/or vomiting:  1. The medication in the patch is effective for 72 hours, after which it should be removed.  Wrap patch in a tissue and discard in the trash. Wash hands thoroughly with soap and water. 2. You may remove the patch earlier than 72 hours if you  experience unpleasant side effects which may include dry mouth, dizziness or visual disturbances. 3. Avoid touching the patch. Wash your hands with soap and water after contact with the patch.

## 2023-02-26 NOTE — Anesthesia Postprocedure Evaluation (Signed)
Anesthesia Post Note  Patient: KESIAH SPITZLEY  Procedure(s) Performed: WIDE LOCAL EXCISION RIGHT FOREARM, ADVANCEMENT FLAP CLOSURE, SKIN SUBSTITUTE COVERAGE ARM MASS (Arm Lower)     Patient location during evaluation: PACU Anesthesia Type: General Level of consciousness: awake and alert Pain management: pain level controlled Vital Signs Assessment: post-procedure vital signs reviewed and stable Respiratory status: spontaneous breathing, nonlabored ventilation, respiratory function stable and patient connected to nasal cannula oxygen Cardiovascular status: blood pressure returned to baseline and stable Postop Assessment: no apparent nausea or vomiting Anesthetic complications: no   No notable events documented.  Last Vitals:  Vitals:   02/26/23 1628 02/26/23 1636  BP: (!) 141/90 (!) 158/100  Pulse: 85 84  Resp: 13 16  Temp:  36.7 C  SpO2: 96% 95%    Last Pain:  Vitals:   02/26/23 1636  TempSrc:   PainSc: 0-No pain                 Harel Repetto

## 2023-02-26 NOTE — Anesthesia Preprocedure Evaluation (Addendum)
Anesthesia Evaluation  Patient identified by MRN, date of birth, ID band Patient awake    Reviewed: Allergy & Precautions, H&P , NPO status , Patient's Chart, lab work & pertinent test results  History of Anesthesia Complications (+) PONV and history of anesthetic complications  Airway Mallampati: I  TM Distance: >3 FB Neck ROM: Full    Dental no notable dental hx. (+) Edentulous Upper, Missing, Dental Advisory Given,    Pulmonary neg pulmonary ROS, COPD,  COPD inhaler, Current Smoker   Pulmonary exam normal breath sounds clear to auscultation       Cardiovascular hypertension, Pt. on medications negative cardio ROS Normal cardiovascular exam+ dysrhythmias Atrial Fibrillation  Rhythm:Regular Rate:Normal  '22 ECHO: EF 60 to 65%. 1. The LV has normal function, no regional wall motion abnormalities.   2. RVF is normal. The right ventricular size is normal. There is normal pulmonary artery systolic pressure.   3. Left atrial size was severely dilated.   4. Right atrial size was mildly dilated.   5. The mitral valve is normal in structure. No evidence of mitral valve regurgitation. No evidence of mitral stenosis.   6. The tricuspid valve is abnormal.   7. The aortic valve has an indeterminant number of cusps. Aortic valve regurgitation is not visualized. No aortic stenosis is present.     Neuro/Psych   Anxiety     negative neurological ROS  negative psych ROS   GI/Hepatic negative GI ROS, Neg liver ROS,GERD  ,,  Endo/Other  negative endocrine ROSHypothyroidism    Renal/GU negative Renal ROS  negative genitourinary   Musculoskeletal negative musculoskeletal ROS (+)    Abdominal   Peds negative pediatric ROS (+)  Hematology negative hematology ROS (+)   Anesthesia Other Findings   Reproductive/Obstetrics negative OB ROS                             Anesthesia Physical Anesthesia Plan  ASA:  3  Anesthesia Plan: General   Post-op Pain Management: Tylenol PO (pre-op)*   Induction: Intravenous  PONV Risk Score and Plan: 3 and Ondansetron, Dexamethasone and Scopolamine patch - Pre-op  Airway Management Planned: LMA  Additional Equipment: None  Intra-op Plan:   Post-operative Plan: Extubation in OR  Informed Consent:   Plan Discussed with: Anesthesiologist  Anesthesia Plan Comments: ( )        Anesthesia Quick Evaluation

## 2023-02-26 NOTE — Transfer of Care (Signed)
Immediate Anesthesia Transfer of Care Note  Patient: Raven Ferguson  Procedure(s) Performed: WIDE LOCAL EXCISION RIGHT FOREARM, ADVANCEMENT FLAP CLOSURE, SKIN SUBSTITUTE COVERAGE ARM MASS (Arm Lower)  Patient Location: PACU  Anesthesia Type:General  Level of Consciousness: awake  Airway & Oxygen Therapy: Patient Spontanous Breathing and Patient connected to face mask oxygen  Post-op Assessment: Report given to RN and Post -op Vital signs reviewed and stable  Post vital signs: Reviewed and stable  Last Vitals:  Vitals Value Taken Time  BP 135/80 02/26/23 1618  Temp    Pulse 97 02/26/23 1620  Resp 21 02/26/23 1620  SpO2 100 % 02/26/23 1620  Vitals shown include unfiled device data.  Last Pain:  Vitals:   02/26/23 1123  TempSrc: Oral  PainSc: 0-No pain      Patients Stated Pain Goal: 5 (02/26/23 1123)  Complications: No notable events documented.

## 2023-02-26 NOTE — Op Note (Signed)
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    DISPOSITION OF SPECIMEN:  PATHOLOGY  COUNTS:  YES  PLAN OF CARE: Discharge to home after PACU  PATIENT DISPOSITION:  PACU - hemodynamically stable.    PROCEDURE:   Pt was identified in the holding area, taken to the OR, and placed supine on the OR table.  General anesthesia was induced.  Time out was performed according to the surgical safety checklist.  When all was correct, we continued.   The patient was placed into the supine position.  The right arm was prepped and draped in sterile fashion.  The sites of recurrence were identified and local was infiltrated into the skin there and into the adjacent tissue.  A #10 blade was used to incise the skin around the cancers.  The cautery was used to take the dissection down to the fascia.  The skin was marked in situ with orientation sutures.  The cautery was used to take the specimen off the fascia, and it was passed off the table.    The defect was measured at 15.0 x 8.9 cm.  Skin hooks were used to elevate the edges of the incision and the skin was freed up in all directions with the cautery to create advancement flaps.  This was pulled together in an oblique orientation but this did not cover the entire region.  The proximal and distal segments of the wound were able to be closed.  Deep interrupted 2-0 vicryl sutures were placed to relieve tension.  The skin was then  reapproximated with 3-0 interrupted vicryl deep dermal sutures and 4-0 monocryl running subcuticular sutures.   2-0 nylon horizontal mattress sutures were placed as well.     The remaining defect was 8.1 x 5.1 cm.  Skin substitute was obtained.  The 7 x 10 cm myriad matrix was trimmed to the appropriate size.  Interrupted 3-0 vicryls were placed to hold down one side of the matrix sheet.  The myriad morcells (500 mg) were then placed onto the wound bed under the matrix sheet.  The matrix was sutured down the rest of the way with interrupted 3-0 vicryls.  Sorbact was selected and secured around the matrix with interrupted 3-0 prolenes.    This was dressed with benzoin and steris on the portion that was closed and then with surgilube and gauze on the matrix with the entire wound being covered with kerlix and coban.    Needle, sponge, and instrument counts were correct.  The patient was awakened from anesthesia and taken to the PACU in stable condition.

## 2023-03-02 LAB — SURGICAL PATHOLOGY

## 2023-03-13 ENCOUNTER — Other Ambulatory Visit: Payer: Self-pay | Admitting: Nurse Practitioner

## 2023-03-16 ENCOUNTER — Encounter: Payer: Self-pay | Admitting: Physician Assistant

## 2023-03-16 ENCOUNTER — Ambulatory Visit: Payer: Commercial Managed Care - HMO | Admitting: Physician Assistant

## 2023-03-16 VITALS — BP 116/81 | Wt 216.4 lb

## 2023-03-16 DIAGNOSIS — I10 Essential (primary) hypertension: Secondary | ICD-10-CM

## 2023-03-16 DIAGNOSIS — F419 Anxiety disorder, unspecified: Secondary | ICD-10-CM

## 2023-03-16 DIAGNOSIS — E039 Hypothyroidism, unspecified: Secondary | ICD-10-CM

## 2023-03-16 DIAGNOSIS — E538 Deficiency of other specified B group vitamins: Secondary | ICD-10-CM

## 2023-03-16 MED ORDER — LEVOTHYROXINE SODIUM 137 MCG PO TABS: 137.0000 ug | ORAL_TABLET | Freq: Every day | ORAL | 1 refills | Status: DC

## 2023-03-16 MED ORDER — CYANOCOBALAMIN 1000 MCG/ML IJ SOLN: 1000.0000 ug | INTRAMUSCULAR | 3 refills | Status: DC

## 2023-03-16 MED ORDER — PAROXETINE HCL 20 MG PO TABS: 20.0000 mg | ORAL_TABLET | Freq: Every day | ORAL | 3 refills | Status: DC

## 2023-03-16 MED ORDER — AMLODIPINE BESYLATE 2.5 MG PO TABS: 2.5000 mg | ORAL_TABLET | Freq: Every day | ORAL | 3 refills | Status: DC

## 2023-03-16 NOTE — Assessment & Plan Note (Signed)
Stable. Meds refilled. Continue on current treatment plan.

## 2023-03-16 NOTE — Progress Notes (Signed)
Established Patient Office Visit  Subjective   Patient ID: Raven Ferguson, female    DOB: 09-14-1960  Age: 62 y.o. MRN: [id number]  Chief Complaint  Patient presents with   Hypertension    Medication follow up    Patient presents today for chronic medical follow up and medication management. She denies any concerns or complaints today. She states she recently underwent local excision of squamous cell carcinoma lesion on her right forearm. Patient overdue for lung cancer screening and influenza vaccine. Patient due Tdap and Zoster vaccine.     Review of Systems  Constitutional:  Negative for chills and fever.  HENT: Negative.    Eyes: Negative.   Respiratory:  Negative for cough and wheezing.   Cardiovascular:  Negative for chest pain, palpitations and leg swelling.  Gastrointestinal: Negative.   Genitourinary:  Negative for dysuria.  Skin: Negative.   Psychiatric/Behavioral:  Negative for depression. The patient is not nervous/anxious.       Objective:     BP 116/81   Wt 216 lb 6.4 oz (98.2 kg)   BMI 37.14 kg/m    Physical Exam Constitutional:      Appearance: Normal appearance. She is obese.  HENT:     Head: Normocephalic.     Nose: Nose normal. No congestion or rhinorrhea.     Mouth/Throat:     Mouth: Mucous membranes are moist.     Pharynx: Oropharynx is clear.  Eyes:     Extraocular Movements: Extraocular movements intact.     Conjunctiva/sclera: Conjunctivae normal.  Neck:     Thyroid: No thyroid mass, thyromegaly or thyroid tenderness.  Cardiovascular:     Rate and Rhythm: Normal rate and regular rhythm.     Heart sounds: No murmur heard.    No gallop.  Pulmonary:     Effort: Pulmonary effort is normal.     Breath sounds: Normal breath sounds. No wheezing, rhonchi or rales.  Musculoskeletal:     Right lower leg: No edema.     Left lower leg: No edema.  Skin:    General: Skin is warm and dry.     Capillary Refill: Capillary refill takes less than 2  seconds.  Neurological:     General: No focal deficit present.     Mental Status: She is alert and oriented to person, place, and time.  Psychiatric:        Mood and Affect: Mood normal.        Behavior: Behavior normal.     The 10-year ASCVD risk score (Arnett DK, et al., 2019) is: 8.6%    Assessment & Plan:   Return in about 6 months (around 09/14/2023) for medical follow up.   Primary hypertension Assessment & Plan: Stable today. Meds refilled. CBC and CMP ordered. Continue with current treatment plan.   Orders: -     CBC with Differential/Platelet -     CMP14+EGFR  Hypothyroidism, unspecified type Assessment & Plan: Currently stable, patient with no complaints today. TSH and T4 ordered today. Meds refilled.  Continue on current treatment pending lab results. Will adjust medication as needed.  Orders: -     TSH + free T4  B12 deficiency Assessment & Plan: Stable. Patient with no complaints today. CBC ordered.  Orders: -     CMP14+EGFR  Anxiety Assessment & Plan: Stable. Meds refilled. Continue on current treatment plan.    Orders: -     CBC with Differential/Platelet  Other orders -  amLODIPine Besylate; Take 1 tablet (2.5 mg total) by mouth daily.  Dispense: 90 tablet; Refill: 3 -     Cyanocobalamin; Inject 1 mL (1,000 mcg total) into the muscle every 30 (thirty) days.  Dispense: 3 mL; Refill: 3 -     Levothyroxine Sodium; Take 1 tablet (137 mcg total) by mouth daily.  Dispense: 90 tablet; Refill: 1 -     PARoxetine HCl; Take 1 tablet (20 mg total) by mouth daily.  Dispense: 90 tablet; Refill: 3   Wound on right forearm following squamous cell carcinoma excision healing as expected today. Patient denies signs of infection such as purulence, warmth, extensive redness, or odor. She has scheduled follow up with dermatology in the near future.   Toni Amend Kaiden Dardis, PA-C

## 2023-03-16 NOTE — Assessment & Plan Note (Signed)
Stable today. Meds refilled. CBC and CMP ordered. Continue with current treatment plan.

## 2023-03-16 NOTE — Assessment & Plan Note (Addendum)
Currently stable, patient with no complaints today. TSH and T4 ordered today. Meds refilled.  Continue on current treatment pending lab results. Will adjust medication as needed.

## 2023-03-16 NOTE — Assessment & Plan Note (Signed)
Stable. Patient with no complaints today. CBC ordered.

## 2023-03-17 LAB — CBC WITH DIFFERENTIAL/PLATELET
Basophils Absolute: 0.1 10*3/uL (ref 0.0–0.2)
Basos: 1 %
EOS (ABSOLUTE): 0.1 10*3/uL (ref 0.0–0.4)
Eos: 1 %
Hematocrit: 50.4 % — ABNORMAL HIGH (ref 34.0–46.6)
Hemoglobin: 16.9 g/dL — ABNORMAL HIGH (ref 11.1–15.9)
Immature Grans (Abs): 0 10*3/uL (ref 0.0–0.1)
Immature Granulocytes: 0 %
Lymphocytes Absolute: 2.2 10*3/uL (ref 0.7–3.1)
Lymphs: 33 %
MCH: 32.2 pg (ref 26.6–33.0)
MCHC: 33.5 g/dL (ref 31.5–35.7)
MCV: 96 fL (ref 79–97)
Monocytes Absolute: 0.6 10*3/uL (ref 0.1–0.9)
Monocytes: 9 %
Neutrophils Absolute: 3.7 10*3/uL (ref 1.4–7.0)
Neutrophils: 56 %
Platelets: 218 10*3/uL (ref 150–450)
RBC: 5.25 x10E6/uL (ref 3.77–5.28)
RDW: 12.1 % (ref 11.7–15.4)
WBC: 6.7 10*3/uL (ref 3.4–10.8)

## 2023-03-17 LAB — CMP14+EGFR
ALT: 15 [IU]/L (ref 0–32)
AST: 19 [IU]/L (ref 0–40)
Albumin: 4.4 g/dL (ref 3.9–4.9)
Alkaline Phosphatase: 119 [IU]/L (ref 44–121)
BUN/Creatinine Ratio: 14 (ref 12–28)
BUN: 11 mg/dL (ref 8–27)
Bilirubin Total: 0.6 mg/dL (ref 0.0–1.2)
CO2: 26 mmol/L (ref 20–29)
Calcium: 9.4 mg/dL (ref 8.7–10.3)
Chloride: 100 mmol/L (ref 96–106)
Creatinine, Ser: 0.81 mg/dL (ref 0.57–1.00)
Globulin, Total: 3 g/dL (ref 1.5–4.5)
Glucose: 88 mg/dL (ref 70–99)
Potassium: 4.6 mmol/L (ref 3.5–5.2)
Sodium: 138 mmol/L (ref 134–144)
Total Protein: 7.4 g/dL (ref 6.0–8.5)
eGFR: 82 mL/min/{1.73_m2} (ref >59)

## 2023-03-17 LAB — TSH+FREE T4
Free T4: 1.12 ng/dL (ref 0.82–1.77)
TSH: 35.6 u[IU]/mL — ABNORMAL HIGH (ref 0.450–4.500)

## 2023-03-20 ENCOUNTER — Ambulatory Visit: Payer: Self-pay

## 2023-03-20 ENCOUNTER — Other Ambulatory Visit (INDEPENDENT_AMBULATORY_CARE_PROVIDER_SITE_OTHER): Payer: Self-pay | Admitting: Physician Assistant

## 2023-03-20 MED ORDER — LEVOTHYROXINE SODIUM 150 MCG PO TABS: 150.0000 ug | ORAL_TABLET | Freq: Every day | ORAL | 3 refills | Status: DC

## 2023-03-20 NOTE — Telephone Encounter (Signed)
Will close out NT encounter since unsure what pt needs and LVMTCB to practice.

## 2023-04-17 NOTE — Progress Notes (Signed)
 PROVIDER:  JINA CLAIR NEPHEW, MD Patient Care Team: Cook, Jayce G, DO as PCP - General (Family Medicine) Register, Jeoffrey Lax, [place] (Dermatology) Shona Rush, MD (Dermatology)  MRN: [id number] DOB: 12-24-1960 DATE OF ENCOUNTER: 04/17/2023 Initial History:    Patient is a 63 year old female who was referred 03/2021.  She had issues with multiple squamous cell carcinomas on her upper extremities for several years.  Most recently, she had recurrences in the upper forearms.  The right side recurred twice and the left side has recurred once.  She had wide local excisions by dermatology on both sides with negative margins and despite this had recurrence.  She desired reexcision again due to pain.      Patient underwent wide local excision of bilateral forearm squamous cell cancers on 04/16/2021.  Margins were negative.  She recovered from this well.   I saw her back in October 2024.  At that point, she had developed multiple recurrent squamous cell cancers in the right forearm scar.  She said it seemed like they were at the suture sites.  She had multiple excisions of the recurrences with dermatology and all of them had negative margins.  Despite this, she continued to develop recurrent squamous cell along the incision and at a site where she had a bandage.  She also continued to have soreness in this arm to the point that she was unable to use his arm to do a lot of normal activities.    Interval History:     Patient underwent wide local excision of the recurrent squamous cell cancers 02/26/2023.  Margins were negative.  This was a difficult area because an elliptical scar had to be made based on her first excision that preceded my surgery.  I did have to use skin substitute in order to bridge the gap.  With her recurrences, I was concerned about the potential for lymph node metastases.  Right axillary ultrasound was negative.  None of these cancers were large enough to warrant sentinel lymph node  biopsy.  At her first postop visit, the patient was having a fair bit of pain.  I did a course of antibiotics.  The pain resolved as the sorbact was removed and then the other sutures.  She denies any issues at this point.  She is using vaseline daily to the wound.       Pathology 02/26/2023 A. SKIN, RIGHT FOREARM, EXCISION:       Invasive squamous cell carcinoma, well-differentiated.       Tumor size: 7 mm in maximal dimension.       Depth of invasion: 2 mm.       Surgical margins of resection are negative for carcinoma.            Carcinoma is 1 mm from the closest deep soft tissue margin.       One focus of necrotizing granuloma.            No fungal elements identified on HE stain.   Physical Examination:   Right forearm: very small area of granulation tissue healing over.  Heaped up scar tissue at the edges.  Silver nitrate applied.    Assessment and Plan:     Diagnoses and all orders for this visit:  Squamous cell cancer of skin of left forearm  Squamous cell cancer of skin of right forearm  Recurrent squamous cell carcinoma of skin   Ok to shower.  Vaseline or other ointment until it completely closes.  Return in about 3 months (around 07/16/2023).   The plan was discussed in detail with the patient today, who expressed understanding.  The patient has my contact information, and understands to call me with any additional questions or concerns in the interval.  I would be happy to see the patient back sooner if the need arises.   JINA CLAIR NEPHEW, MD

## 2023-05-19 ENCOUNTER — Other Ambulatory Visit: Payer: Self-pay | Admitting: Nurse Practitioner

## 2023-06-11 IMAGING — CT CT CHEST LUNG CANCER SCREENING LOW DOSE W/O CM
1 series · 10 of 10 positions shown, 13 images · non-contrast
Comparison: None.

CLINICAL DATA: 61-year-old female with 44 pack-year history of
smoking. Lung cancer screening.



[ct lung segmentation data · axial · 0.71mm/px · z∈[+1181,+1181]mm · 10 of 296 frames shown]
[frame 1/296  mediastinal]
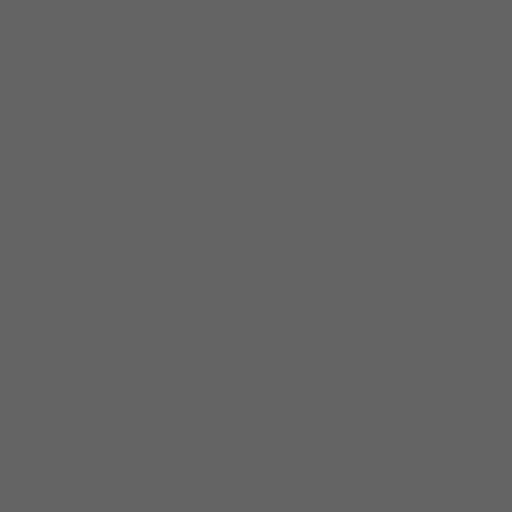
[frame 1/296  lung]
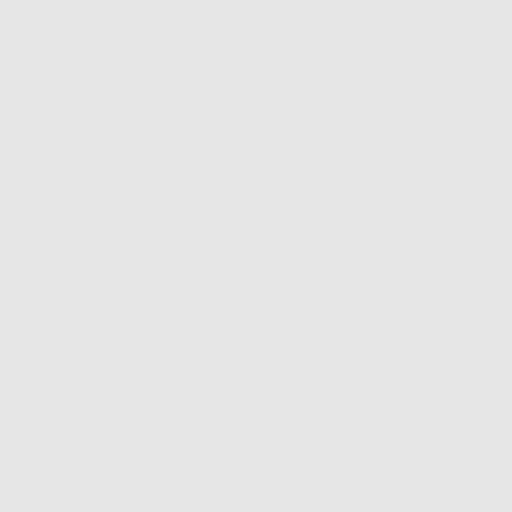
[frame 33/296  lung]
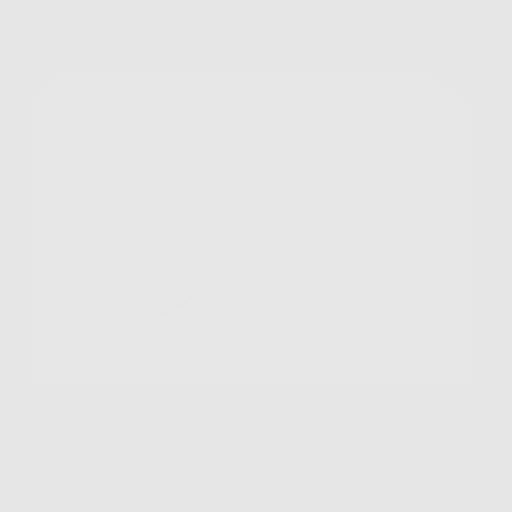
[frame 66/296  lung]
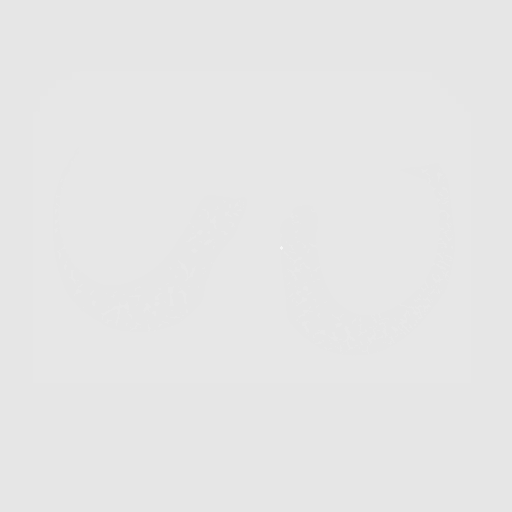
[frame 99/296  lung]
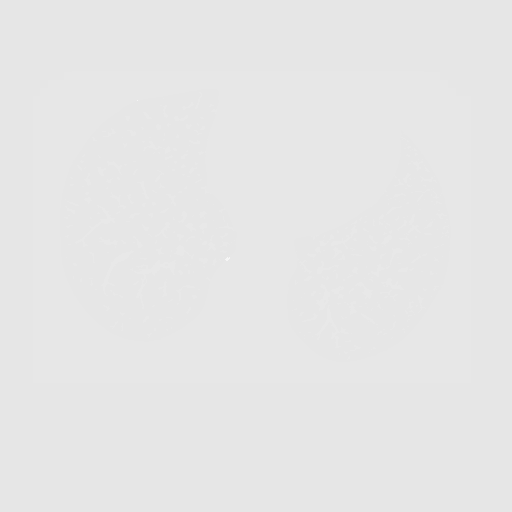
[frame 132/296  mediastinal]
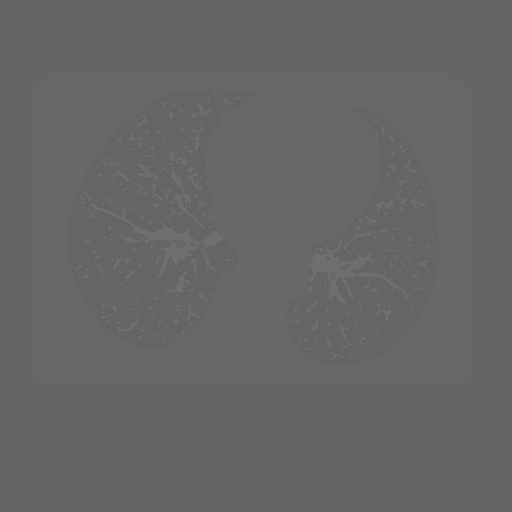
[frame 132/296  lung]
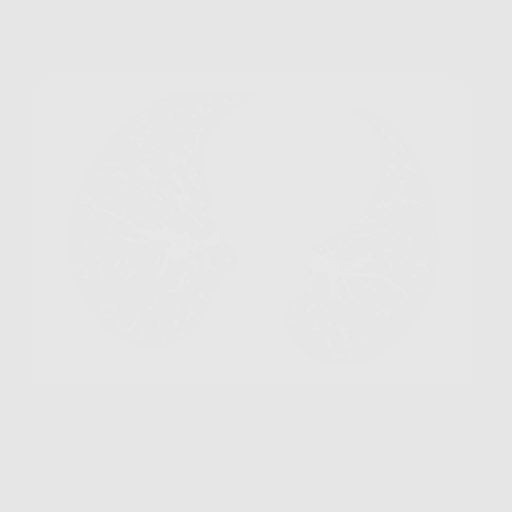
[frame 164/296  lung]
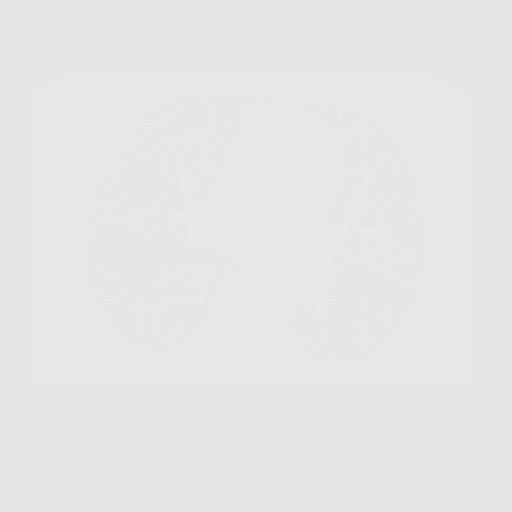
[frame 197/296  lung]
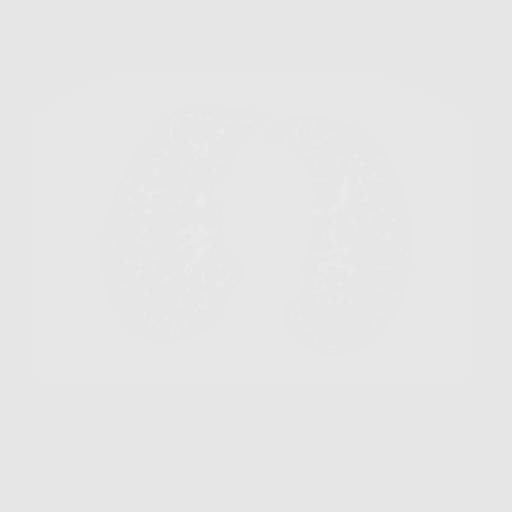
[frame 230/296  lung]
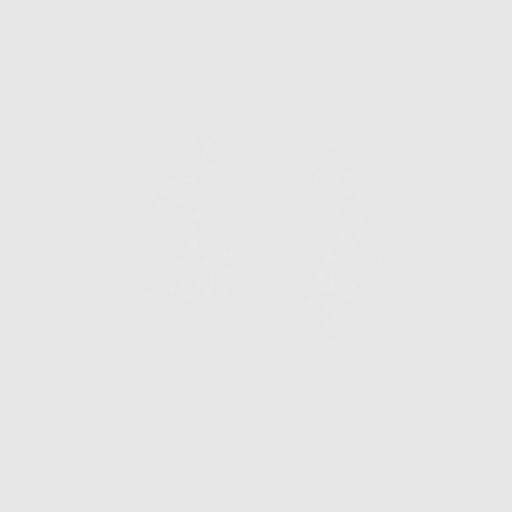
[frame 263/296  mediastinal]
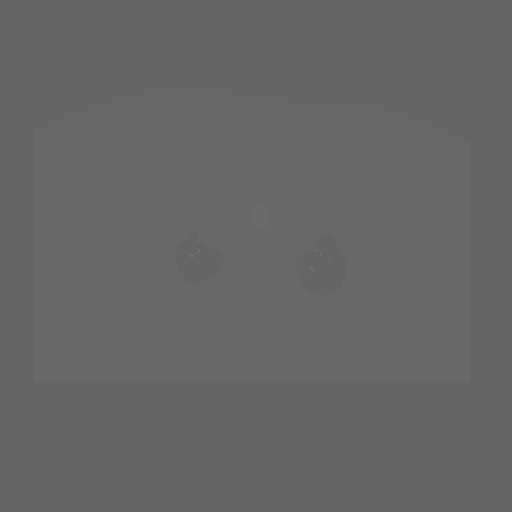
[frame 263/296  lung]
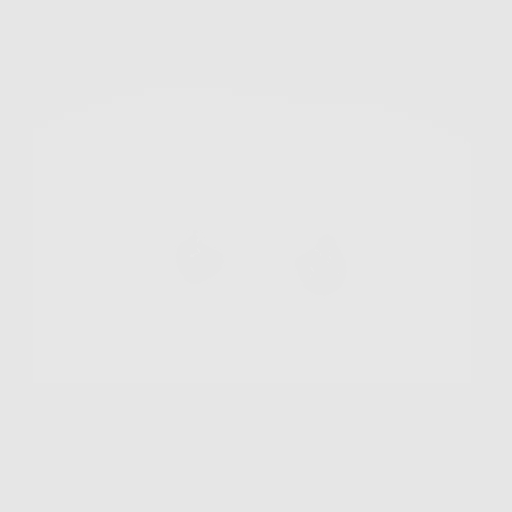
[frame 296/296  lung]
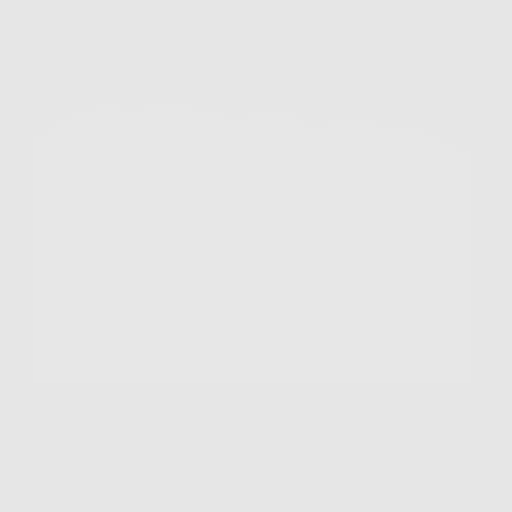

[10 of 10 positions shown; findings below may reference images not displayed]

FINDINGS: Cardiovascular: The heart size is normal. No substantial pericardial
effusion. Coronary artery calcification is evident. No thoracic
aortic aneurysm. No substantial atherosclerosis of the thoracic
aorta. Enlargement of the pulmonary outflow tract/main pulmonary
arteries suggests pulmonary arterial hypertension.

Mediastinum/Nodes: No mediastinal lymphadenopathy. No evidence for
gross hilar lymphadenopathy although assessment is limited by the
lack of intravenous contrast on the current study. The esophagus has
normal imaging features. There is no axillary lymphadenopathy.

Lungs/Pleura: Centrilobular emphsyema noted. Several right-sided
lung nodules are identified measuring up to 6.4 mm the posterior
right upper lobe (image 81). No overtly suspicious nodule or mass.
No focal airspace consolidation. No pleural effusion.

Upper Abdomen: Status post gastrojejunostomy.

Musculoskeletal: No worrisome lytic or sclerotic osseous
abnormality.
IMPRESSION: 1. Lung-RADS 3, probably benign findings. Short-term follow-up in 6
months is recommended with repeat low-dose chest CT without contrast
(please use the following order, "CT CHEST LCS NODULE FOLLOW-UP W/O
CM").
2.  Emphysema ([id number]-CH2.2)

These results will be called to the ordering clinician or
representative by the Radiologist Assistant, and communication
documented in the PACS or [REDACTED].

## 2023-08-27 ENCOUNTER — Encounter: Payer: Self-pay | Admitting: Nurse Practitioner

## 2023-08-27 ENCOUNTER — Ambulatory Visit: Admitting: Nurse Practitioner

## 2023-08-27 VITALS — BP 134/90 | HR 89 | Temp 98.2°F | Ht 64.0 in | Wt 220.2 lb

## 2023-08-27 DIAGNOSIS — I10 Essential (primary) hypertension: Secondary | ICD-10-CM | POA: Diagnosis not present

## 2023-08-27 DIAGNOSIS — K458 Other specified abdominal hernia without obstruction or gangrene: Secondary | ICD-10-CM

## 2023-08-28 ENCOUNTER — Encounter: Payer: Self-pay | Admitting: Nurse Practitioner

## 2023-08-28 ENCOUNTER — Ambulatory Visit: Payer: Self-pay | Admitting: Nurse Practitioner

## 2023-08-28 LAB — BASIC METABOLIC PANEL WITH GFR
BUN/Creatinine Ratio: 20 (ref 12–28)
BUN: 15 mg/dL (ref 8–27)
CO2: 21 mmol/L (ref 20–29)
Calcium: 9.4 mg/dL (ref 8.7–10.3)
Chloride: 101 mmol/L (ref 96–106)
Creatinine, Ser: 0.76 mg/dL (ref 0.57–1.00)
Glucose: 84 mg/dL (ref 70–99)
Potassium: 4.1 mmol/L (ref 3.5–5.2)
Sodium: 138 mmol/L (ref 134–144)
eGFR: 88 mL/min/{1.73_m2} (ref >59)

## 2023-08-28 NOTE — Progress Notes (Signed)
 Subjective:    Patient ID: Raven Ferguson, female    DOB: June 01, 1960, 63 y.o.   MRN: [id number]  HPI Presents for recheck of her abdominal hernia. Has been there a long time but started causing pain 2 days ago. Better at this point but still sore. No fever. No constipation or diarrhea. No nausea or vomiting. SH: gastric bypass; skin reduction with hernia repair.  Of note, she quit smoking back in February. 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   Review of Systems  Constitutional:  Negative for fever.  Respiratory:  Negative for cough, chest tightness, shortness of breath and wheezing.        Breathing much improved since quitting smoking.   Cardiovascular:  Negative for chest pain.       Has A fib.  Gastrointestinal:  Positive for abdominal pain. Negative for constipation, diarrhea, nausea and vomiting.      08/27/2023    2:56 PM  Depression screen PHQ 2/9  Decreased Interest 0  Down, Depressed, Hopeless 0  PHQ - 2 Score 0  Altered sleeping 0  Tired, decreased energy 0  Change in appetite 0  Feeling bad or failure about yourself  0  Trouble concentrating 0  Moving slowly or fidgety/restless 0  Suicidal thoughts 0  PHQ-9 Score 0      08/27/2023    2:56 PM 11/21/2020    4:25 PM 02/24/2020   11:26 AM  GAD 7 : Generalized Anxiety Score  Nervous, Anxious, on Edge 0 1 0  Control/stop worrying 0 1 1  Worry too much - different things 0 0 1  Trouble relaxing 0 0 0  Restless 0 0 0  Easily annoyed or irritable 0 1 1  Afraid - awful might happen 0 0 0  Total GAD 7 Score 0 3 3  Anxiety Difficulty  Somewhat difficult Somewhat difficult    Social History   Tobacco Use   Smoking status: Former    Current packs/day: 0.00    Average packs/day: 1 pack/day for 47.0 years (47.0 ttl pk-yrs)    Types: Cigarettes    Start date: 04/10/1976    Quit date: 04/25/2023    Years since quitting: 0.3   Smokeless tobacco: Never  Vaping Use   Vaping status: Never  Used  Substance Use Topics   Alcohol use: No    Alcohol/week: 0.0 standard drinks of alcohol   Drug use: No   Past Surgical History:  Procedure Laterality Date   ABDOMINAL ADVANCEMENT FLAP Bilateral 04/16/2021   Procedure: WIDE LOCAL EXCISION WITH ADVANCEMENT FLAP CLOSURE BILATERAL UPPER EXTREMITY;  Surgeon: Lockie Rima, MD;  Location: [facility] OR;  Service: General;  Laterality: Bilateral;   CESAREAN SECTION     CHOLECYSTECTOMY     COLONOSCOPY WITH ESOPHAGOGASTRODUODENOSCOPY (EGD) N/A 12/02/2012   ONG:EXBMWU post bariatric surgery. Abnormal anastomosis of doubtful clinical significance-status post biopsy (benign)/Colonic diverticulosis. Single colonic polyp-removed. Bx benign. Next TCS 11/2022.   GASTRIC BYPASS  03/24/2002   skin reduction     with hernia repair   TONSILLECTOMY          Objective:   Physical Exam Vitals and nursing note reviewed.  Constitutional:      General: She is not in acute distress. Cardiovascular:     Rate and Rhythm: Normal rate. Rhythm irregular.     Heart sounds: Normal heart sounds. No murmur heard.    Comments: Slightly irregular rhythm.  Pulmonary:     Effort:  Pulmonary effort is normal.     Breath sounds: Normal breath sounds.  Abdominal:     Palpations: There is mass.     Tenderness: There is abdominal tenderness. There is guarding.     Hernia: A hernia is present.     Comments: Vertical scar noted from mid pubic area to umbilicus. Large 12x15 cm hernia noted at the upper portion of the scar tissue. 5 cm at the largest elevation. Firm, tender area noted with surrounding non tender soft tissue. Remainder of exam unremarkable.   Neurological:     Mental Status: She is alert.  Psychiatric:        Mood and Affect: Mood normal.        Behavior: Behavior normal.        Thought Content: Thought content normal.        Judgment: Judgment normal.    Today's Vitals   08/27/23 1446  BP: (!) 134/90  Pulse: 89  Temp: 98.2 F (36.8 C)  SpO2: 97%   Weight: 220 lb 3.2 oz (99.9 kg)  Height: 5\' 4"  (1.626 m)   Body mass index is 37.8 kg/m.         Assessment & Plan:   Problem List Items Addressed This Visit       Cardiovascular and Mediastinum   Primary hypertension   Relevant Orders   Basic metabolic panel with GFR     Other   Recurrent abdominal hernia without obstruction or gangrene - Primary   Relevant Orders   Ambulatory referral to General Surgery   CT ABDOMEN W WO CONTRAST   CT of the abdomen ordered. BMP ordered due to contrast use.  Referred back to [facility]. Warning signs reviewed. Patient advised to go to ED new or worsening symptoms.  Follow up here as needed.

## 2023-09-14 ENCOUNTER — Encounter: Payer: Self-pay | Admitting: Family Medicine

## 2023-09-14 ENCOUNTER — Ambulatory Visit: Payer: Commercial Managed Care - HMO | Admitting: Family Medicine

## 2023-09-14 VITALS — BP 130/76 | HR 96 | Temp 97.3°F | Ht 64.0 in | Wt 222.0 lb

## 2023-09-14 DIAGNOSIS — F419 Anxiety disorder, unspecified: Secondary | ICD-10-CM

## 2023-09-14 DIAGNOSIS — E039 Hypothyroidism, unspecified: Secondary | ICD-10-CM

## 2023-09-14 DIAGNOSIS — I10 Essential (primary) hypertension: Secondary | ICD-10-CM | POA: Diagnosis not present

## 2023-09-14 MED ORDER — AMLODIPINE BESYLATE 2.5 MG PO TABS: 2.5000 mg | ORAL_TABLET | Freq: Every day | ORAL | 3 refills | Status: AC

## 2023-09-14 MED ORDER — PAROXETINE HCL 20 MG PO TABS: 20.0000 mg | ORAL_TABLET | Freq: Every day | ORAL | 3 refills | Status: AC

## 2023-09-14 MED ORDER — CYANOCOBALAMIN 1000 MCG/ML IJ SOLN: 1000.0000 ug | INTRAMUSCULAR | 3 refills | Status: AC

## 2023-09-14 MED ORDER — LEVOTHYROXINE SODIUM 150 MCG PO TABS: 150.0000 ug | ORAL_TABLET | Freq: Every day | ORAL | 3 refills | Status: DC

## 2023-09-14 MED ORDER — DIAZEPAM 10 MG PO TABS: 10.0000 mg | ORAL_TABLET | Freq: Every evening | ORAL | 0 refills | Status: DC | PRN

## 2023-09-14 NOTE — Progress Notes (Signed)
 Subjective:  Patient ID: Raven Ferguson, female    DOB: 12-16-1960  Age: 63 y.o. MRN: [id number]  CC:   Chief Complaint  Patient presents with   Follow-up    6 month f/u anemia, b 12 deficiency, hypertension and hypothyroidism     HPI:  63 year old female presents for follow up.  Doing well. Has quite smoking and is feeling very well.  BP well controlled on Norvasc . Needs refill.  Needs repeat TSH. Last TSH was elevated. Compliant with Levothyroxine .   Anxiety stable on Paxil .    Patient Active Problem List   Diagnosis Date Noted   B12 deficiency 12/08/2021   Persistent atrial fibrillation (HCC) 05/09/2021   History of iron  deficiency anemia 04/14/2020   Hidradenitis suppurativa 04/13/2020   Primary hypertension 04/13/2020   Recurrent abdominal hernia without obstruction or gangrene 08/25/2016   Anxiety 01/16/2013   Insomnia 01/16/2013   Hypothyroidism 01/16/2013   GERD (gastroesophageal reflux disease) 10/11/2012    Social Hx   Social History   Socioeconomic History   Marital status: Married    Spouse name: Not on file   Number of children: Not on file   Years of education: Not on file   Highest education level: Not on file  Occupational History   Not on file  Tobacco Use   Smoking status: Former    Current packs/day: 0.00    Average packs/day: 1 pack/day for 47.0 years (47.0 ttl pk-yrs)    Types: Cigarettes    Start date: 04/10/1976    Quit date: 04/25/2023    Years since quitting: 0.3   Smokeless tobacco: Never  Vaping Use   Vaping status: Never Used  Substance and Sexual Activity   Alcohol use: No    Alcohol/week: 0.0 standard drinks of alcohol   Drug use: No   Sexual activity: Not Currently    Birth control/protection: Post-menopausal  Other Topics Concern   Not on file  Social History Narrative   Not on file   Social Drivers of Health   Financial Resource Strain: Not on file  Food Insecurity: No Food Insecurity (01/12/2023)   Hunger Vital  Sign    Worried About Running Out of Food in the Last Year: Never true    Ran Out of Food in the Last Year: Never true  Transportation Needs: No Transportation Needs (01/12/2023)   PRAPARE - Administrator, Civil Service (Medical): No    Lack of Transportation (Non-Medical): No  Physical Activity: Not on file  Stress: Not on file  Social Connections: Not on file    Review of Systems  Respiratory: Negative.    Cardiovascular: Negative.     Objective:  BP 130/76   Pulse 96   Temp (!) 97.3 F (36.3 C)   Ht 5' 4 (1.626 m)   Wt 222 lb (100.7 kg)   SpO2 98%   BMI 38.11 kg/m      09/14/2023   11:00 AM 08/27/2023    2:46 PM 03/16/2023    3:46 PM  BP/Weight  Systolic BP 130 134 116  Diastolic BP 76 90 81  Wt. (Lbs) 222 220.2 216.4  BMI 38.11 kg/m2 37.8 kg/m2 37.14 kg/m2    Physical Exam Vitals and nursing note reviewed.  Constitutional:      General: She is not in acute distress.    Appearance: Normal appearance.  HENT:     Head: Normocephalic and atraumatic.   Eyes:     General:  Right eye: No discharge.        Left eye: No discharge.     Conjunctiva/sclera: Conjunctivae normal.    Cardiovascular:     Rate and Rhythm: Normal rate.  Pulmonary:     Effort: Pulmonary effort is normal.     Breath sounds: Normal breath sounds. No wheezing or rales.  Abdominal:     Comments: Large ventral hernia.   Neurological:     Mental Status: She is alert.   Psychiatric:        Mood and Affect: Mood normal.        Behavior: Behavior normal.     Lab Results  Component Value Date   WBC 6.7 03/16/2023   HGB 16.9 (H) 03/16/2023   HCT 50.4 (H) 03/16/2023   PLT 218 03/16/2023   GLUCOSE 84 08/27/2023   CHOL 191 03/25/2022   TRIG 99 03/25/2022   HDL 55 03/25/2022   LDLCALC 118 (H) 03/25/2022   ALT 15 03/16/2023   AST 19 03/16/2023   NA 138 08/27/2023   K 4.1 08/27/2023   CL 101 08/27/2023   CREATININE 0.76 08/27/2023   BUN 15 08/27/2023   CO2 21  08/27/2023   TSH 35.600 (H) 03/16/2023   HGBA1C 5.7 (H) 03/25/2022     Assessment & Plan:  Primary hypertension Assessment & Plan: Stable. Medication refilled.  Orders: -     amLODIPine  Besylate; Take 1 tablet (2.5 mg total) by mouth daily.  Dispense: 90 tablet; Refill: 3  Hypothyroidism, unspecified type Assessment & Plan: TSH ordered. Levothyroxine  refilled.  Orders: -     Levothyroxine  Sodium; Take 1 tablet (150 mcg total) by mouth daily.  Dispense: 90 tablet; Refill: 3 -     TSH  Anxiety Assessment & Plan: Stable. Paxil  refilled.  Orders: -     PARoxetine  HCl; Take 1 tablet (20 mg total) by mouth daily.  Dispense: 90 tablet; Refill: 3  Other orders -     Cyanocobalamin ; Inject 1 mL (1,000 mcg total) into the muscle every 30 (thirty) days.  Dispense: 3 mL; Refill: 3 -     diazePAM ; Take 1 tablet (10 mg total) by mouth at bedtime as needed for anxiety.  Dispense: 30 tablet; Refill: 0    Follow-up:  6 months  Toluwani Ruder Bluford DO [facility]

## 2023-09-14 NOTE — Assessment & Plan Note (Signed)
TSH ordered  Levothyroxine refilled

## 2023-09-14 NOTE — Patient Instructions (Signed)
 Lab today.  Meds refilled.  Follow up in 6 months.

## 2023-09-14 NOTE — Assessment & Plan Note (Signed)
 Stable. Paxil  refilled.

## 2023-09-14 NOTE — Assessment & Plan Note (Signed)
Stable.  Medication refilled 

## 2023-09-15 ENCOUNTER — Other Ambulatory Visit: Payer: Self-pay | Admitting: Family Medicine

## 2023-09-15 ENCOUNTER — Ambulatory Visit: Payer: Self-pay | Admitting: Family Medicine

## 2023-09-15 DIAGNOSIS — E039 Hypothyroidism, unspecified: Secondary | ICD-10-CM

## 2023-09-15 LAB — TSH: TSH: 19 u[IU]/mL — ABNORMAL HIGH (ref 0.450–4.500)

## 2023-09-15 MED ORDER — LEVOTHYROXINE SODIUM 175 MCG PO TABS: 175.0000 ug | ORAL_TABLET | Freq: Every day | ORAL | 0 refills | Status: DC

## 2023-09-18 ENCOUNTER — Ambulatory Visit (HOSPITAL_COMMUNITY)
Admission: RE | Admit: 2023-09-18 | Discharge: 2023-09-18 | Disposition: A | Discharge status: Home / Self Care | Source: Ambulatory Visit | Attending: Nurse Practitioner | Admitting: Nurse Practitioner

## 2023-09-18 DIAGNOSIS — K458 Other specified abdominal hernia without obstruction or gangrene: Secondary | ICD-10-CM | POA: Insufficient documentation

## 2023-09-18 MED ORDER — IOHEXOL 350 MG/ML SOLN
80.0000 mL | Freq: Once | INTRAVENOUS | Status: AC | PRN
Start: 2023-09-18 — End: 2023-09-18
  Administered 2023-09-18: 80 mL via INTRAVENOUS

## 2023-10-12 ENCOUNTER — Telehealth: Payer: Self-pay

## 2023-10-12 NOTE — Telephone Encounter (Signed)
 Pt is coming by still not heard anything from a Surgical Team about hernia surgery referral was put on on 08/27/2023 by Elveria

## 2023-10-12 NOTE — Telephone Encounter (Signed)
 Per Referral Coordinator: Looks like she was sent to [facility] I know they stay backed up with their Referrals - Since Patient is already an established Patient (seen in [place] of this year) she can call their Office and check on Referral status :) [facility] will only schedule Appts with the Patient :)

## 2023-12-31 ENCOUNTER — Other Ambulatory Visit: Payer: Self-pay | Admitting: Family Medicine

## 2023-12-31 DIAGNOSIS — E039 Hypothyroidism, unspecified: Secondary | ICD-10-CM

## 2024-01-27 ENCOUNTER — Other Ambulatory Visit: Payer: Self-pay | Admitting: Family Medicine

## 2024-03-28 DIAGNOSIS — E039 Hypothyroidism, unspecified: Secondary | ICD-10-CM

## 2024-04-20 ENCOUNTER — Other Ambulatory Visit: Payer: Self-pay | Admitting: Family Medicine

## 2024-04-20 DIAGNOSIS — E039 Hypothyroidism, unspecified: Secondary | ICD-10-CM
